# Patient Record
Sex: Male | Born: 1995 | Race: Black or African American | Hispanic: No | Marital: Single | Smoking: Never smoker
Health system: Southern US, Community
[De-identification: ages and names within clinical notes are randomized; demographics above are authoritative.]

## PROBLEM LIST (undated history)

## (undated) DIAGNOSIS — Z789 Other specified health status: Secondary | ICD-10-CM

## (undated) HISTORY — PX: NO PAST SURGERIES: SHX2092

---

## 2021-01-23 ENCOUNTER — Encounter (HOSPITAL_COMMUNITY): Payer: Self-pay | Admitting: *Deleted

## 2021-01-23 ENCOUNTER — Inpatient Hospital Stay (HOSPITAL_COMMUNITY)
Admission: EM | Admit: 2021-01-23 | Discharge: 2021-01-27 | DRG: 440 | Disposition: A | Discharge status: Home / Self Care | Payer: BC Managed Care – PPO | Attending: Internal Medicine | Admitting: Internal Medicine

## 2021-01-23 ENCOUNTER — Emergency Department (HOSPITAL_COMMUNITY): Payer: BC Managed Care – PPO

## 2021-01-23 ENCOUNTER — Emergency Department (HOSPITAL_COMMUNITY)
Admission: EM | Admit: 2021-01-23 | Discharge: 2021-01-23 | Disposition: A | Discharge status: Home / Self Care | Payer: BC Managed Care – PPO | Source: Home / Self Care | Attending: Emergency Medicine | Admitting: Emergency Medicine

## 2021-01-23 ENCOUNTER — Encounter (HOSPITAL_COMMUNITY): Payer: Self-pay

## 2021-01-23 ENCOUNTER — Other Ambulatory Visit: Payer: Self-pay

## 2021-01-23 DIAGNOSIS — K852 Alcohol induced acute pancreatitis without necrosis or infection: Secondary | ICD-10-CM | POA: Insufficient documentation

## 2021-01-23 DIAGNOSIS — R1013 Epigastric pain: Secondary | ICD-10-CM | POA: Diagnosis not present

## 2021-01-23 DIAGNOSIS — K859 Acute pancreatitis without necrosis or infection, unspecified: Secondary | ICD-10-CM | POA: Diagnosis present

## 2021-01-23 DIAGNOSIS — K219 Gastro-esophageal reflux disease without esophagitis: Secondary | ICD-10-CM | POA: Diagnosis present

## 2021-01-23 DIAGNOSIS — K858 Other acute pancreatitis without necrosis or infection: Secondary | ICD-10-CM | POA: Diagnosis present

## 2021-01-23 DIAGNOSIS — Z20822 Contact with and (suspected) exposure to covid-19: Secondary | ICD-10-CM | POA: Diagnosis present

## 2021-01-23 DIAGNOSIS — F1010 Alcohol abuse, uncomplicated: Secondary | ICD-10-CM | POA: Diagnosis present

## 2021-01-23 HISTORY — DX: Other specified health status: Z78.9

## 2021-01-23 LAB — URINALYSIS, ROUTINE W REFLEX MICROSCOPIC
Bacteria, UA: NONE SEEN
Glucose, UA: NEGATIVE mg/dL
Ketones, ur: 80 mg/dL — AB
Leukocytes,Ua: NEGATIVE
Nitrite: NEGATIVE
Protein, ur: >=300 mg/dL — AB
Specific Gravity, Urine: 1.039 — ABNORMAL HIGH (ref 1.005–1.030)
pH: 5 (ref 5.0–8.0)

## 2021-01-23 LAB — COMPREHENSIVE METABOLIC PANEL
ALT: 24 U/L (ref 0–44)
ALT: 22 U/L (ref 0–44)
AST: 28 U/L (ref 15–41)
AST: 27 U/L (ref 15–41)
Albumin: 4.8 g/dL (ref 3.5–5.0)
Albumin: 4.8 g/dL (ref 3.5–5.0)
Alkaline Phosphatase: 54 U/L (ref 38–126)
Alkaline Phosphatase: 55 U/L (ref 38–126)
Anion gap: 12 (ref 5–15)
Anion gap: 14 (ref 5–15)
BUN: 10 mg/dL (ref 6–20)
BUN: 9 mg/dL (ref 6–20)
CO2: 27 mmol/L (ref 22–32)
CO2: 27 mmol/L (ref 22–32)
Calcium: 10.1 mg/dL (ref 8.9–10.3)
Calcium: 10.2 mg/dL (ref 8.9–10.3)
Chloride: 98 mmol/L (ref 98–111)
Chloride: 95 mmol/L — ABNORMAL LOW (ref 98–111)
Creatinine, Ser: 1.23 mg/dL (ref 0.61–1.24)
Creatinine, Ser: 1.22 mg/dL (ref 0.61–1.24)
GFR, Estimated: >60 mL/min (ref >60)
GFR, Estimated: >60 mL/min (ref >60)
Glucose, Bld: 147 mg/dL — ABNORMAL HIGH (ref 70–99)
Glucose, Bld: 150 mg/dL — ABNORMAL HIGH (ref 70–99)
Potassium: 3.7 mmol/L (ref 3.5–5.1)
Potassium: 3.7 mmol/L (ref 3.5–5.1)
Sodium: 137 mmol/L (ref 135–145)
Sodium: 136 mmol/L (ref 135–145)
Total Bilirubin: 1.6 mg/dL — ABNORMAL HIGH (ref 0.3–1.2)
Total Bilirubin: 1.6 mg/dL — ABNORMAL HIGH (ref 0.3–1.2)
Total Protein: 8.4 g/dL — ABNORMAL HIGH (ref 6.5–8.1)
Total Protein: 8.3 g/dL — ABNORMAL HIGH (ref 6.5–8.1)

## 2021-01-23 LAB — CBC WITH DIFFERENTIAL/PLATELET
Abs Immature Granulocytes: 0.03 10*3/uL (ref 0.00–0.07)
Basophils Absolute: 0 10*3/uL (ref 0.0–0.1)
Basophils Relative: 0 %
Eosinophils Absolute: 0 10*3/uL (ref 0.0–0.5)
Eosinophils Relative: 0 %
HCT: 46.4 % (ref 39.0–52.0)
Hemoglobin: 15.9 g/dL (ref 13.0–17.0)
Immature Granulocytes: 0 %
Lymphocytes Relative: 9 %
Lymphs Abs: 0.9 10*3/uL (ref 0.7–4.0)
MCH: 31.3 pg (ref 26.0–34.0)
MCHC: 34.3 g/dL (ref 30.0–36.0)
MCV: 91.3 fL (ref 80.0–100.0)
Monocytes Absolute: 0.7 10*3/uL (ref 0.1–1.0)
Monocytes Relative: 6 %
Neutro Abs: 9 10*3/uL — ABNORMAL HIGH (ref 1.7–7.7)
Neutrophils Relative %: 85 %
Platelets: 307 10*3/uL (ref 150–400)
RBC: 5.08 MIL/uL (ref 4.22–5.81)
RDW: 13.1 % (ref 11.5–15.5)
WBC: 10.7 10*3/uL — ABNORMAL HIGH (ref 4.0–10.5)
nRBC: 0 % (ref 0.0–0.2)

## 2021-01-23 LAB — CBC
HCT: 44.3 % (ref 39.0–52.0)
Hemoglobin: 14.9 g/dL (ref 13.0–17.0)
MCH: 31 pg (ref 26.0–34.0)
MCHC: 33.6 g/dL (ref 30.0–36.0)
MCV: 92.3 fL (ref 80.0–100.0)
Platelets: 292 10*3/uL (ref 150–400)
RBC: 4.8 MIL/uL (ref 4.22–5.81)
RDW: 13.3 % (ref 11.5–15.5)
WBC: 9.4 10*3/uL (ref 4.0–10.5)
nRBC: 0 % (ref 0.0–0.2)

## 2021-01-23 LAB — ETHANOL: Alcohol, Ethyl (B): <10 mg/dL (ref <10)

## 2021-01-23 LAB — RESP PANEL BY RT-PCR (FLU A&B, COVID) ARPGX2
Influenza A by PCR: NEGATIVE
Influenza B by PCR: NEGATIVE
SARS Coronavirus 2 by RT PCR: NEGATIVE

## 2021-01-23 LAB — LIPASE, BLOOD
Lipase: 314 U/L — ABNORMAL HIGH (ref 11–51)
Lipase: 1027 U/L — ABNORMAL HIGH (ref 11–51)

## 2021-01-23 MED ORDER — IOHEXOL 300 MG/ML  SOLN
100.0000 mL | Freq: Once | INTRAMUSCULAR | Status: AC | PRN
  Administered 2021-01-23: 100 mL via INTRAVENOUS

## 2021-01-23 MED ORDER — ONDANSETRON 4 MG PO TBDP
4.0000 mg | ORAL_TABLET | Freq: Once | ORAL | Status: AC
  Administered 2021-01-23: 4 mg via ORAL
  Filled 2021-01-23: qty 1

## 2021-01-23 MED ORDER — ONDANSETRON 4 MG PO TBDP: 4.0000 mg | ORAL_TABLET | Freq: Three times a day (TID) | ORAL | 0 refills | Status: DC | PRN

## 2021-01-23 MED ORDER — HYDROMORPHONE HCL 1 MG/ML IJ SOLN
1.0000 mg | Freq: Once | INTRAMUSCULAR | Status: AC
  Administered 2021-01-23: 1 mg via INTRAVENOUS
  Filled 2021-01-23: qty 1

## 2021-01-23 MED ORDER — LIDOCAINE VISCOUS HCL 2 % MT SOLN
15.0000 mL | Freq: Once | OROMUCOSAL | Status: AC
  Administered 2021-01-23: 15 mL via ORAL
  Filled 2021-01-23: qty 15

## 2021-01-23 MED ORDER — ENOXAPARIN SODIUM 40 MG/0.4ML IJ SOSY
40.0000 mg | PREFILLED_SYRINGE | INTRAMUSCULAR | Status: DC
  Administered 2021-01-23 – 2021-01-26 (×4): 40 mg via SUBCUTANEOUS
  Filled 2021-01-23 (×4): qty 0.4

## 2021-01-23 MED ORDER — SODIUM CHLORIDE 0.9% FLUSH
3.0000 mL | Freq: Two times a day (BID) | INTRAVENOUS | Status: DC
  Administered 2021-01-24 – 2021-01-27 (×5): 3 mL via INTRAVENOUS

## 2021-01-23 MED ORDER — ALUM & MAG HYDROXIDE-SIMETH 200-200-20 MG/5ML PO SUSP
30.0000 mL | Freq: Once | ORAL | Status: AC
  Administered 2021-01-23: 30 mL via ORAL
  Filled 2021-01-23: qty 30

## 2021-01-23 MED ORDER — ONDANSETRON HCL 4 MG/2ML IJ SOLN
4.0000 mg | Freq: Once | INTRAMUSCULAR | Status: AC
  Administered 2021-01-23: 4 mg via INTRAVENOUS
  Filled 2021-01-23: qty 2

## 2021-01-23 MED ORDER — SODIUM CHLORIDE (PF) 0.9 % IJ SOLN
INTRAMUSCULAR | Status: AC
  Filled 2021-01-23: qty 50

## 2021-01-23 MED ORDER — ONDANSETRON HCL 4 MG/2ML IJ SOLN
4.0000 mg | Freq: Four times a day (QID) | INTRAMUSCULAR | Status: DC | PRN
  Administered 2021-01-24: 4 mg via INTRAVENOUS
  Filled 2021-01-23: qty 2

## 2021-01-23 MED ORDER — FENTANYL CITRATE (PF) 100 MCG/2ML IJ SOLN
50.0000 ug | Freq: Once | INTRAMUSCULAR | Status: AC
Start: 2021-01-23 — End: 2021-01-23
  Administered 2021-01-23: 50 ug via INTRAVENOUS
  Filled 2021-01-23: qty 2

## 2021-01-23 MED ORDER — SODIUM CHLORIDE 0.9 % IV BOLUS
1000.0000 mL | Freq: Once | INTRAVENOUS | Status: AC
  Administered 2021-01-23: 1000 mL via INTRAVENOUS

## 2021-01-23 MED ORDER — ONDANSETRON HCL 4 MG PO TABS: 4.0000 mg | ORAL_TABLET | Freq: Four times a day (QID) | ORAL | Status: DC | PRN

## 2021-01-23 MED ORDER — LACTATED RINGERS IV SOLN: INTRAVENOUS | Status: AC

## 2021-01-23 MED ORDER — OXYCODONE-ACETAMINOPHEN 5-325 MG PO TABS
1.0000 | ORAL_TABLET | Freq: Once | ORAL | Status: AC
  Administered 2021-01-23: 1 via ORAL
  Filled 2021-01-23: qty 1

## 2021-01-23 MED ORDER — MORPHINE SULFATE (PF) 2 MG/ML IV SOLN
2.0000 mg | INTRAVENOUS | Status: DC | PRN
  Administered 2021-01-23 – 2021-01-25 (×10): 2 mg via INTRAVENOUS
  Filled 2021-01-23 (×11): qty 1

## 2021-01-23 MED ORDER — HYDROMORPHONE HCL 1 MG/ML IJ SOLN
0.5000 mg | Freq: Once | INTRAMUSCULAR | Status: AC
  Administered 2021-01-23: 0.5 mg via INTRAVENOUS
  Filled 2021-01-23: qty 1

## 2021-01-23 NOTE — ED Notes (Signed)
ED TO INPATIENT HANDOFF REPORT  ED Nurse Name and Phone #: [phone], Charise Carwin RN   S Name/Age/Gender Garrett Kane 25 y.o. male Room/Bed: WA10/WA10  Code Status   Code Status: Full Code  Home/SNF/Other Home Patient oriented to: self, place, time and situation Is this baseline? Yes   Triage Complete: Triage complete  Chief Complaint Acute pancreatitis without infection or necrosis, unspecified pancreatitis type [K85.90]  Triage Note Patient arrived stating he was discharged today at 4pm diagnosed with pancreatitis and told to come back if pain gets worse. States his pharmacy was closed and now his pain is back      Allergies No Known Allergies  Level of Care/Admitting Diagnosis ED Disposition    ED Disposition  Admit   Condition  --   Comment  Hospital Area: [facility] [[id number]]  Level of Care: Med-Surg [16]  May place patient in observation at [facility] or [facility] if equivalent level of care is available:: Yes  Covid Evaluation: Confirmed COVID Negative  Diagnosis: Acute pancreatitis without infection or necrosis, unspecified pancreatitis type [[id number]]  Admitting Physician: Charlsie Quest [[id number]]  Attending Physician: Charlsie Quest [[id number]]         B Medical/Surgery History Past Medical History:  Diagnosis Date  . Medical history non-contributory    Past Surgical History:  Procedure Laterality Date  . NO PAST SURGERIES       A IV Location/Drains/Wounds Patient Lines/Drains/Airways Status    Active Line/Drains/Airways    Name Placement date Placement time Site Days   Peripheral IV 01/23/21 22 G Right Antecubital 01/23/21  2012  Antecubital  less than 1   Peripheral IV 01/23/21 20 G Left;Posterior Hand 01/23/21  2127  Hand  less than 1          Intake/Output Last 24 hours  Intake/Output Summary (Last 24 hours) at 01/23/2021 2350 Last data filed at 01/23/2021 2248 Gross per 24 hour  Intake 1000 ml  Output --   Net 1000 ml    Labs/Imaging Results for orders placed or performed during the hospital encounter of 01/23/21 (from the past 48 hour(s))  Lipase, blood     Status: Abnormal   Collection Time: 01/23/21  8:04 PM  Result Value Ref Range   Lipase 1,027 (H) 11 - 51 U/L    Comment: RESULTS CONFIRMED BY MANUAL DILUTION Performed at [facility], [address], [place], [place] [address]   Ethanol     Status: None   Collection Time: 01/23/21  8:04 PM  Result Value Ref Range   Alcohol, Ethyl (B) <10 <10 mg/dL    Comment: (NOTE) Lowest detectable limit for serum alcohol is 10 mg/dL.  For medical purposes only. Performed at [facility], [address] [place], [place] [address]   Comprehensive metabolic panel     Status: Abnormal   Collection Time: 01/23/21  8:04 PM  Result Value Ref Range   Sodium 136 135 - 145 mmol/L   Potassium 3.7 3.5 - 5.1 mmol/L   Chloride 95 (L) 98 - 111 mmol/L   CO2 27 22 - 32 mmol/L   Glucose, Bld 150 (H) 70 - 99 mg/dL    Comment: Glucose reference range applies only to samples taken after fasting for at least 8 hours.   BUN 9 6 - 20 mg/dL   Creatinine, Ser 1.54 0.61 - 1.24 mg/dL   Calcium 00.8 8.9 - 67.6 mg/dL   Total Protein 8.3 (H) 6.5 -  8.1 g/dL   Albumin 4.8 3.5 - 5.0 g/dL   AST 27 15 - 41 U/L   ALT 22 0 - 44 U/L   Alkaline Phosphatase 55 38 - 126 U/L   Total Bilirubin 1.6 (H) 0.3 - 1.2 mg/dL   GFR, Estimated >12 >45 mL/min    Comment: (NOTE) Calculated using the CKD-EPI Creatinine Equation (2021)    Anion gap 14 5 - 15    Comment: Performed at [facility], [address], [place], [place] [address]  CBC with Differential     Status: Abnormal   Collection Time: 01/23/21  8:04 PM  Result Value Ref Range   WBC 10.7 (H) 4.0 - 10.5 K/uL   RBC 5.08 4.22 - 5.81 MIL/uL   Hemoglobin 15.9 13.0 - 17.0 g/dL   HCT 33.8 25.0 - 53.9 %   MCV 91.3 80.0 - 100.0 fL   MCH 31.3 26.0 - 34.0 pg   MCHC  34.3 30.0 - 36.0 g/dL   RDW 76.7 34.1 - 93.7 %   Platelets 307 150 - 400 K/uL   nRBC 0.0 0.0 - 0.2 %   Neutrophils Relative % 85 %   Neutro Abs 9.0 (H) 1.7 - 7.7 K/uL   Lymphocytes Relative 9 %   Lymphs Abs 0.9 0.7 - 4.0 K/uL   Monocytes Relative 6 %   Monocytes Absolute 0.7 0.1 - 1.0 K/uL   Eosinophils Relative 0 %   Eosinophils Absolute 0.0 0.0 - 0.5 K/uL   Basophils Relative 0 %   Basophils Absolute 0.0 0.0 - 0.1 K/uL   Immature Granulocytes 0 %   Abs Immature Granulocytes 0.03 0.00 - 0.07 K/uL    Comment: Performed at [facility], [address], [place], [place] [address]  Resp Panel by RT-PCR (Flu A&B, Covid) Nasopharyngeal Swab     Status: None   Collection Time: 01/23/21  8:05 PM   Specimen: Nasopharyngeal Swab; Nasopharyngeal(NP) swabs in vial transport medium  Result Value Ref Range   SARS Coronavirus 2 by RT PCR NEGATIVE NEGATIVE    Comment: (NOTE) SARS-CoV-2 target nucleic acids are NOT DETECTED.  The SARS-CoV-2 RNA is generally detectable in upper respiratory specimens during the acute phase of infection. The lowest concentration of SARS-CoV-2 viral copies this assay can detect is 138 copies/mL. A negative result does not preclude SARS-Cov-2 infection and should not be used as the sole basis for treatment or other patient management decisions. A negative result may occur with  improper specimen collection/handling, submission of specimen other than nasopharyngeal swab, presence of viral mutation(s) within the areas targeted by this assay, and inadequate number of viral copies(<138 copies/mL). A negative result must be combined with clinical observations, patient history, and epidemiological information. The expected result is Negative.  Fact Sheet for Patients:  BloggerCourse.com  Fact Sheet for Healthcare Providers:  SeriousBroker.it  This test is no t yet approved or cleared by the [place] FDA and  has been authorized for detection and/or diagnosis of SARS-CoV-2 by FDA under an Emergency Use Authorization (EUA). This EUA will remain  in effect (meaning this test can be used) for the duration of the COVID-19 declaration under Section 564(b)(1) of the Act, 21 U.S.C.section 360bbb-3(b)(1), unless the authorization is terminated  or revoked sooner.       Influenza A by PCR NEGATIVE NEGATIVE   Influenza B by PCR NEGATIVE NEGATIVE    Comment: (NOTE) The Xpert Xpress SARS-CoV-2/FLU/RSV plus assay is intended as an aid  in the diagnosis of influenza from Nasopharyngeal swab specimens and should not be used as a sole basis for treatment. Nasal washings and aspirates are unacceptable for Xpert Xpress SARS-CoV-2/FLU/RSV testing.  Fact Sheet for Patients: BloggerCourse.comhttps://www.fda.gov/media/152166/download  Fact Sheet for Healthcare Providers: SeriousBroker.ithttps://www.fda.gov/media/152162/download  This test is not yet approved or cleared by the [place] FDA and has been authorized for detection and/or diagnosis of SARS-CoV-2 by FDA under an Emergency Use Authorization (EUA). This EUA will remain in effect (meaning this test can be used) for the duration of the COVID-19 declaration under Section 564(b)(1) of the Act, 21 U.S.C. section 360bbb-3(b)(1), unless the authorization is terminated or revoked.  Performed at [facility], [address] [place], [place] [id number]    CT ABDOMEN PELVIS W CONTRAST  Result Date: 01/23/2021 CLINICAL DATA:  Severe epigastric pain, concern for pancreatitis EXAM: CT ABDOMEN AND PELVIS WITH CONTRAST TECHNIQUE: Multidetector CT imaging of the abdomen and pelvis was performed using the standard protocol following bolus administration of intravenous contrast. CONTRAST:  100mL OMNIPAQUE IOHEXOL 300 MG/ML  SOLN COMPARISON:  None. FINDINGS: Lower chest: Lung bases are clear. Normal heart size. No pericardial effusion. Hepatobiliary: No  worrisome focal liver abnormality is seen. Normal gallbladder. No visible calcified gallstones. No biliary ductal dilatation. Pancreas: Diffusely edematous appearance of the pancreas with some slight heterogeneity towards the pancreatic tail. Surrounding edematous peripancreatic fluid is noted. No large regions of necrosis. No pancreatic ductal dilatation. No organized abscess or collection. Spleen: Normal in size. No concerning splenic lesions. Adrenals/Urinary Tract: Normal adrenals. Kidneys are normally located with symmetric enhancement. No suspicious renal lesion, urolithiasis or hydronephrosis. Urinary bladder is largely decompressed at the time of exam and therefore poorly evaluated by CT imaging. Stomach/Bowel: Distal esophagus and stomach are unremarkable. Thickened and heterogeneous appearance of the duodenal sweep, particularly towards the head and uncinate of the pancreas may be reactive duodenitis. No other small bowel thickening or dilatation. Some mild edematous changes are seen along the colon, particularly within the retroperitoneal segments which may be reactive to the peripancreatic inflammatory change. Normal appendix. No evidence of obstruction. Vascular/Lymphatic: No significant vascular findings are present. No enlarged abdominal or pelvic lymph nodes. Reproductive: The prostate and seminal vesicles are unremarkable. Other: With peripancreatic edematous fluid also with fluid tracking in the retroperitoneum and along the right pericolic gutter. No free air. No bowel containing hernia. Musculoskeletal: No acute osseous abnormality or suspicious osseous lesion. IMPRESSION: 1. Features most consistent with an acute interstitial edematous pancreatitis. Some slight heterogeneity of the pancreatic tail is more nonspecific without large region pancreatic necrosis or organized collection at this time. Adjacent edematous features and likely reactive fluid throughout the retroperitoneum and tracking into  right pericolic gutter. Recommend correlation with lipase. 2. Thickening and heterogeneity of the duodenal sweep, particularly the 2nd to 3rd portion as it crosses adjacent the duodenal head and uncinate is favored to be reactive. Additional likely reactive edematous thickening of the colon as well. Could correlate with clinical assessment of bowel symptoms Electronically Signed   By: Kreg ShropshirePrice  DeHay M.D.   On: 01/23/2021 15:18    Pending Labs Unresulted Labs (From admission, onward)    Start     Ordered   01/24/21 0500  HIV Antibody (routine testing w rflx)  (HIV Antibody (Routine testing w reflex) panel)  Tomorrow morning,   R        01/23/21 2226   01/24/21 0500  Hemoglobin A1c  Tomorrow morning,   R  01/23/21 2226   01/24/21 0500  Comprehensive metabolic panel  Tomorrow morning,   R        01/23/21 2226   01/24/21 0500  CBC  Tomorrow morning,   R        01/23/21 2226   01/23/21 2004  Urine rapid drug screen (hosp performed)  ONCE - STAT,   STAT        01/23/21 2004   01/23/21 2004  Urinalysis, Routine w reflex microscopic  ONCE - STAT,   STAT        01/23/21 2004          Vitals/Pain Today's Vitals   01/23/21 2145 01/23/21 2200 01/23/21 2230 01/23/21 2249  BP: (!) 141/105 (!) 133/104 (!) 156/110   Pulse: 79 91 73   Resp:      Temp:      TempSrc:      SpO2: 100% 93% 100%   PainSc:    7     Isolation Precautions No active isolations  Medications Medications  morphine 2 MG/ML injection 2 mg (2 mg Intravenous Given 01/23/21 2242)  enoxaparin (LOVENOX) injection 40 mg (40 mg Subcutaneous Given 01/23/21 2242)  sodium chloride flush (NS) 0.9 % injection 3 mL (3 mLs Intravenous Not Given 01/23/21 2249)  lactated ringers infusion ( Intravenous New Bag/Given 01/23/21 2243)  ondansetron (ZOFRAN) tablet 4 mg (has no administration in time range)    Or  ondansetron (ZOFRAN) injection 4 mg (has no administration in time range)  sodium chloride 0.9 % bolus 1,000 mL (0 mLs  Intravenous Stopped 01/23/21 2248)  HYDROmorphone (DILAUDID) injection 0.5 mg (0.5 mg Intravenous Given 01/23/21 2018)  ondansetron (ZOFRAN) injection 4 mg (4 mg Intravenous Given 01/23/21 2017)  HYDROmorphone (DILAUDID) injection 1 mg (1 mg Intravenous Given 01/23/21 2116)    Mobility walks Low fall risk   Focused Assessments    R Recommendations: See Admitting Provider Note  Report given to:   Additional Notes:

## 2021-01-23 NOTE — ED Notes (Signed)
Pt went to the bathroom to void but was not able to

## 2021-01-23 NOTE — Discharge Instructions (Addendum)
You were diagnosed with pancreatitis today.  For the next couple days, recommend only very bland and boring foods.  Stay hydrated.  Take Zofran as needed for nausea and vomiting.  If you develop uncontrolled vomiting, pain, fever or other new concerning symptom, come back to ER for reassessment.  It is very important that you stay away from alcohol as this is likely the trigger of your pancreatitis today.  Please follow-up with a gastroenterologist regarding your pancreatitis.  They may recommend additional testing.

## 2021-01-23 NOTE — ED Provider Notes (Signed)
[facility]EM[facility]GENCY DEPT Provider Note   CSN: [id number] Arrival date & time: 01/23/21  1936     History Chief Complaint  Patient presents with   Pancreatitis    Garrett Kane is a 25 y.o. male.  25 year old male with prior medical history as detailed below presents for evaluation.  Patient was seen earlier today for same complaint.  He was diagnosed with pancreatitis.  Patient returns now stating that he was unable to get pain medications for home use.  New complaint of significant abdominal discomfort.  He reported associated nausea and vomiting.  He denies prior hx of pancreatitis. He drinks alcohol everyday.   The history is provided by the patient and medical records.  Illness Location:  Abd pain, pancreatitis Severity:  Severe Onset quality:  Gradual Duration:  2 days Timing:  Constant Progression:  Worsening Chronicity:  New     Past Medical History:  Diagnosis Date   Medical history non-contributory     Patient Active Problem List   Diagnosis Date Noted   Acute pancreatitis without infection or necrosis, unspecified pancreatitis type 01/23/2021    Past Surgical History:  Procedure Laterality Date   NO PAST SURGERIES         History reviewed. No pertinent family history.  Social History   Tobacco Use   Smoking status: Never   Smokeless tobacco: Never  Vaping Use   Vaping Use: Never used  Substance Use Topics   Alcohol use: Yes    Comment: drinks almost daily   Drug use: Yes    Types: Marijuana    Comment: daily use    Home Medications Prior to Admission medications   Medication Sig Start Date End Date Taking? Authorizing Provider  ondansetron (ZOFRAN ODT) 4 MG disintegrating tablet Take 1 tablet (4 mg total) by mouth every 8 (eight) hours as needed for nausea or vomiting. 01/23/21   Milagros Loll, MD    Allergies    Patient has no known allergies.  Review of Systems   Review of Systems  All other systems  reviewed and are negative.  Physical Exam Updated Vital Signs BP (!) 143/107   Pulse 75   Temp 97.8 F (36.6 C) (Oral)   Resp 17   SpO2 98%   Physical Exam Vitals and nursing note reviewed.  Constitutional:      General: He is not in acute distress.    Appearance: Normal appearance. He is well-developed.  HENT:     Head: Normocephalic and atraumatic.  Eyes:     Conjunctiva/sclera: Conjunctivae normal.     Pupils: Pupils are equal, round, and reactive to light.  Cardiovascular:     Rate and Rhythm: Normal rate and regular rhythm.     Heart sounds: Normal heart sounds.  Pulmonary:     Effort: Pulmonary effort is normal. No respiratory distress.     Breath sounds: Normal breath sounds.  Abdominal:     General: There is no distension.     Palpations: Abdomen is soft.     Tenderness: There is abdominal tenderness.  Musculoskeletal:        General: No deformity. Normal range of motion.     Cervical back: Normal range of motion and neck supple.  Skin:    General: Skin is warm and dry.  Neurological:     General: No focal deficit present.     Mental Status: He is alert and oriented to person, place, and time.    ED Results /  Procedures / Treatments   Labs (all labs ordered are listed, but only abnormal results are displayed) Labs Reviewed  COMPREHENSIVE METABOLIC PANEL - Abnormal; Notable for the following components:      Result Value   Chloride 95 (*)    Glucose, Bld 150 (*)    Total Protein 8.3 (*)    Total Bilirubin 1.6 (*)    All other components within normal limits  CBC WITH DIFFERENTIAL/PLATELET - Abnormal; Notable for the following components:   WBC 10.7 (*)    Neutro Abs 9.0 (*)    All other components within normal limits  RESP PANEL BY RT-PCR (FLU A&B, COVID) ARPGX2  ETHANOL  LIPASE, BLOOD  RAPID URINE DRUG SCREEN, HOSP PERFORMED  URINALYSIS, ROUTINE W REFLEX MICROSCOPIC    EKG None  Radiology CT ABDOMEN PELVIS W CONTRAST  Result Date:  01/23/2021 CLINICAL DATA:  Severe epigastric pain, concern for pancreatitis EXAM: CT ABDOMEN AND PELVIS WITH CONTRAST TECHNIQUE: Multidetector CT imaging of the abdomen and pelvis was performed using the standard protocol following bolus administration of intravenous contrast. CONTRAST:  OMNIPAQUE IOHEXOL 300 MG/ML  SOLN COMPARISON:  None. FINDINGS: Lower chest: Lung bases are clear. Normal heart size. No pericardial effusion. Hepatobiliary: No worrisome focal liver abnormality is seen. Normal gallbladder. No visible calcified gallstones. No biliary ductal dilatation. Pancreas: Diffusely edematous appearance of the pancreas with some slight heterogeneity towards the pancreatic tail. Surrounding edematous peripancreatic fluid is noted. No large regions of necrosis. No pancreatic ductal dilatation. No organized abscess or collection. Spleen: Normal in size. No concerning splenic lesions. Adrenals/Urinary Tract: Normal adrenals. Kidneys are normally located with symmetric enhancement. No suspicious renal lesion, urolithiasis or hydronephrosis. Urinary bladder is largely decompressed at the time of exam and therefore poorly evaluated by CT imaging. Stomach/Bowel: Distal esophagus and stomach are unremarkable. Thickened and heterogeneous appearance of the duodenal sweep, particularly towards the head and uncinate of the pancreas may be reactive duodenitis. No other small bowel thickening or dilatation. Some mild edematous changes are seen along the colon, particularly within the retroperitoneal segments which may be reactive to the peripancreatic inflammatory change. Normal appendix. No evidence of obstruction. Vascular/Lymphatic: No significant vascular findings are present. No enlarged abdominal or pelvic lymph nodes. Reproductive: The prostate and seminal vesicles are unremarkable. Other: With peripancreatic edematous fluid also with fluid tracking in the retroperitoneum and along the right pericolic gutter. No  free air. No bowel containing hernia. Musculoskeletal: No acute osseous abnormality or suspicious osseous lesion. IMPRESSION: 1. Features most consistent with an acute interstitial edematous pancreatitis. Some slight heterogeneity of the pancreatic tail is more nonspecific without large region pancreatic necrosis or organized collection at this time. Adjacent edematous features and likely reactive fluid throughout the retroperitoneum and tracking into right pericolic gutter. Recommend correlation with lipase. 2. Thickening and heterogeneity of the duodenal sweep, particularly the 2nd to 3rd portion as it crosses adjacent the duodenal head and uncinate is favored to be reactive. Additional likely reactive edematous thickening of the colon as well. Could correlate with clinical assessment of bowel symptoms Electronically Signed   By: Kreg Shropshire M.D.   On: 01/23/2021 15:18    Procedures Procedures   Medications Ordered in ED Medications  sodium chloride 0.9 % bolus 1,000 mL (1,000 mLs Intravenous New Bag/Given 01/23/21 2021)  HYDROmorphone (DILAUDID) injection 0.5 mg (0.5 mg Intravenous Given 01/23/21 2018)  ondansetron (ZOFRAN) injection 4 mg (4 mg Intravenous Given 01/23/21 2017)  HYDROmorphone (DILAUDID) injection 1 mg (1 mg Intravenous Given  01/23/21 2116)    ED Course  I have reviewed the triage vital signs and the nursing notes.  Pertinent labs & imaging results that were available during my care of the patient were reviewed by me and considered in my medical decision making (see chart for details).    MDM Rules/Calculators/A&P                          MDM  MSE complete  Jayon Matton was evaluated in Emergency Department on 01/23/2021 for the symptoms described in the history of present illness. He was evaluated in the context of the global COVID-19 pandemic, which necessitated consideration that the patient might be at risk for infection with the SARS-CoV-2 virus that causes COVID-19.  Institutional protocols and algorithms that pertain to the evaluation of patients at risk for COVID-19 are in a state of rapid change based on information released by regulatory bodies including the CDC and federal and state organizations. These policies and algorithms were followed during the patient's care in the ED.   Patient with recently diagnosed pancreatitis.  Patient with uncontrolled pain.  Patient will require admission for further work-up and treatment.  Hospitalist service is aware of case and will evaluate for same.  Final Clinical Impression(s) / ED Diagnoses Final diagnoses:  Acute pancreatitis, unspecified complication status, unspecified pancreatitis type    Rx / DC Orders ED Discharge Orders     None        Wynetta Fines, MD 01/23/21 2148

## 2021-01-23 NOTE — ED Provider Notes (Signed)
[facility]-EM[facility]GENCY DEPT Provider Note   CSN: [id number] Arrival date & time: 01/23/21  1033     History Chief Complaint  Patient presents with   Abdominal Pain    epigastric   Back Pain    Garrett Kane is a 25 y.o. male.  Presents to ER with concern for abdominal pain.  Patient reports pain is primarily in his upper abdomen, does radiate to back, sharp, stabbing pain.  Relatively constant.  Has been ongoing for the past day or so.  Associated nausea this morning, multiple episodes of vomiting, nonbloody nonbilious.  No pain with urination, has noted urine slightly darker than normal.  States that he does drink heavily on a regular basis.  Reports he has had similar episodes to this pain today and was diagnosed with gastritis.  Denies having prior history of pancreatitis.  HPI     Past Medical History:  Diagnosis Date   Medical history non-contributory     Patient Active Problem List   Diagnosis Date Noted   Acute pancreatitis without infection or necrosis, unspecified pancreatitis type 01/23/2021    Past Surgical History:  Procedure Laterality Date   NO PAST SURGERIES         No family history on file.  Social History   Tobacco Use   Smoking status: Never   Smokeless tobacco: Never  Vaping Use   Vaping Use: Never used  Substance Use Topics   Alcohol use: Yes    Comment: drinks almost daily, about 1 pint liquor daily   Drug use: Yes    Types: Marijuana    Comment: daily use    Home Medications Prior to Admission medications   Medication Sig Start Date End Date Taking? Authorizing Provider  ondansetron (ZOFRAN ODT) 4 MG disintegrating tablet Take 1 tablet (4 mg total) by mouth every 8 (eight) hours as needed for nausea or vomiting. Patient not taking: No sig reported 01/23/21  Yes Travian Kerner, Quitman Livings, MD  ibuprofen (ADVIL) 200 MG tablet Take 400 mg by mouth every 6 (six) hours as needed.    [provider]    Allergies     Patient has no known allergies.  Review of Systems   Review of Systems  Constitutional:  Negative for chills and fever.  HENT:  Negative for ear pain and sore throat.   Eyes:  Negative for pain and visual disturbance.  Respiratory:  Negative for cough and shortness of breath.   Cardiovascular:  Negative for chest pain and palpitations.  Gastrointestinal:  Positive for abdominal pain, nausea and vomiting.  Genitourinary:  Negative for dysuria and hematuria.  Musculoskeletal:  Negative for arthralgias and back pain.  Skin:  Negative for color change and rash.  Neurological:  Negative for seizures and syncope.  All other systems reviewed and are negative.  Physical Exam Updated Vital Signs BP (!) 141/96 (BP Location: Left Arm)   Pulse 79   Temp 98.8 F (37.1 C) (Oral)   Resp 16   SpO2 100%   Physical Exam Vitals and nursing note reviewed.  Constitutional:      Appearance: He is well-developed.  HENT:     Head: Normocephalic and atraumatic.  Eyes:     Conjunctiva/sclera: Conjunctivae normal.  Cardiovascular:     Rate and Rhythm: Normal rate and regular rhythm.     Heart sounds: No murmur heard. Pulmonary:     Effort: Pulmonary effort is normal. No respiratory distress.     Breath sounds: Normal breath  sounds.  Abdominal:     Palpations: Abdomen is soft.     Tenderness: There is abdominal tenderness. There is no guarding or rebound.  Musculoskeletal:     Cervical back: Neck supple.  Skin:    General: Skin is warm and dry.  Neurological:     Mental Status: He is alert.    ED Results / Procedures / Treatments   Labs (all labs ordered are listed, but only abnormal results are displayed) Labs Reviewed  LIPASE, BLOOD - Abnormal; Notable for the following components:      Result Value   Lipase 314 (*)    All other components within normal limits  COMPREHENSIVE METABOLIC PANEL - Abnormal; Notable for the following components:   Glucose, Bld 147 (*)    Total Protein  8.4 (*)    Total Bilirubin 1.6 (*)    All other components within normal limits  URINALYSIS, ROUTINE W REFLEX MICROSCOPIC - Abnormal; Notable for the following components:   Color, Urine AMBER (*)    APPearance HAZY (*)    Specific Gravity, Urine 1.039 (*)    Hgb urine dipstick MODERATE (*)    Bilirubin Urine SMALL (*)    Ketones, ur 80 (*)    Protein, ur >=300 (*)    All other components within normal limits  CBC    EKG None  Radiology CT ABDOMEN PELVIS W CONTRAST  Result Date: 01/23/2021 CLINICAL DATA:  Severe epigastric pain, concern for pancreatitis EXAM: CT ABDOMEN AND PELVIS WITH CONTRAST TECHNIQUE: Multidetector CT imaging of the abdomen and pelvis was performed using the standard protocol following bolus administration of intravenous contrast. CONTRAST:  OMNIPAQUE IOHEXOL 300 MG/ML  SOLN COMPARISON:  None. FINDINGS: Lower chest: Lung bases are clear. Normal heart size. No pericardial effusion. Hepatobiliary: No worrisome focal liver abnormality is seen. Normal gallbladder. No visible calcified gallstones. No biliary ductal dilatation. Pancreas: Diffusely edematous appearance of the pancreas with some slight heterogeneity towards the pancreatic tail. Surrounding edematous peripancreatic fluid is noted. No large regions of necrosis. No pancreatic ductal dilatation. No organized abscess or collection. Spleen: Normal in size. No concerning splenic lesions. Adrenals/Urinary Tract: Normal adrenals. Kidneys are normally located with symmetric enhancement. No suspicious renal lesion, urolithiasis or hydronephrosis. Urinary bladder is largely decompressed at the time of exam and therefore poorly evaluated by CT imaging. Stomach/Bowel: Distal esophagus and stomach are unremarkable. Thickened and heterogeneous appearance of the duodenal sweep, particularly towards the head and uncinate of the pancreas may be reactive duodenitis. No other small bowel thickening or dilatation. Some mild  edematous changes are seen along the colon, particularly within the retroperitoneal segments which may be reactive to the peripancreatic inflammatory change. Normal appendix. No evidence of obstruction. Vascular/Lymphatic: No significant vascular findings are present. No enlarged abdominal or pelvic lymph nodes. Reproductive: The prostate and seminal vesicles are unremarkable. Other: With peripancreatic edematous fluid also with fluid tracking in the retroperitoneum and along the right pericolic gutter. No free air. No bowel containing hernia. Musculoskeletal: No acute osseous abnormality or suspicious osseous lesion. IMPRESSION: 1. Features most consistent with an acute interstitial edematous pancreatitis. Some slight heterogeneity of the pancreatic tail is more nonspecific without large region pancreatic necrosis or organized collection at this time. Adjacent edematous features and likely reactive fluid throughout the retroperitoneum and tracking into right pericolic gutter. Recommend correlation with lipase. 2. Thickening and heterogeneity of the duodenal sweep, particularly the 2nd to 3rd portion as it crosses adjacent the duodenal head and uncinate is favored  to be reactive. Additional likely reactive edematous thickening of the colon as well. Could correlate with clinical assessment of bowel symptoms Electronically Signed   By: Kreg Shropshire M.D.   On: 01/23/2021 15:18    Procedures Procedures   Medications Ordered in ED Medications  alum & mag hydroxide-simeth (MAALOX/MYLANTA) 200-200-20 MG/5ML suspension 30 mL (30 mLs Oral Given 01/23/21 1330)    And  lidocaine (XYLOCAINE) 2 % viscous mouth solution 15 mL (15 mLs Oral Given 01/23/21 1331)  ondansetron (ZOFRAN-ODT) disintegrating tablet 4 mg (4 mg Oral Given 01/23/21 1315)  oxyCODONE-acetaminophen (PERCOCET/ROXICET) 5-325 MG per tablet 1 tablet (1 tablet Oral Given 01/23/21 1314)  fentaNYL (SUBLIMAZE) injection 50 mcg (50 mcg Intravenous Given 01/23/21  1423)  iohexol (OMNIPAQUE) 300 MG/ML solution 100 mL (100 mLs Intravenous Contrast Given 01/23/21 1442)  sodium chloride (PF) 0.9 % injection (  Given by Other 01/23/21 1512)    ED Course  I have reviewed the triage vital signs and the nursing notes.  Pertinent labs & imaging results that were available during my care of the patient were reviewed by me and considered in my medical decision making (see chart for details).    MDM Rules/Calculators/A&P                          25 year old male presents to ER with concern for epigastric pain.  On exam he appears well in no distress, was noted to have tenderness on exam but isolated to epigastric region.  Basic lab work was concerning for elevated lipase.  CT scan checks to further evaluate.  Consistent with acute pancreatitis.  No abscess or pseudocyst.  On reassessment, patient's symptoms were very well controlled, he was able to tolerate fluids orally without difficulty.  Discussed diagnosis of pancreatitis, at present believe he is appropriate for trial of outpatient management with plan to follow-up with primary doctor and gastroenterology.  Given his reported history of alcohol abuse, strong suspicion that this is the culprit.  Counseled on alcohol cessation.  Lengthy discussion with patient regarding return precautions, dietary recommendations, discharged home.    After the discussed management above, the patient was determined to be safe for discharge.  The patient was in agreement with this plan and all questions regarding their care were answered.  ED return precautions were discussed and the patient will return to the ED with any significant worsening of condition.  Final Clinical Impression(s) / ED Diagnoses Final diagnoses:  Alcohol-induced acute pancreatitis, unspecified complication status    Rx / DC Orders ED Discharge Orders          Ordered    ondansetron (ZOFRAN ODT) 4 MG disintegrating tablet  Every 8 hours PRN         01/23/21 1539             Milagros Loll, MD 01/24/21 [phone]

## 2021-01-23 NOTE — ED Notes (Signed)
Pt was taken to CT

## 2021-01-23 NOTE — H&P (Signed)
History and Physical    Garrett Kane ACZ:[id number] DOB: January 17, 1996 DOA: 01/23/2021  PCP: Pcp, No  Patient coming from: Home  I have personally briefly reviewed patient's old medical records in [place]  Chief Complaint: Abdominal pain  HPI: Garrett Kane is a 25 y.o. male with medical history significant for alcohol use who presented to the ED for evaluation of abdominal pain.  Patient reports 2 days of new onset upper abdominal pain with radiation to his back described as a sharp sensation.  He has associated nausea, vomiting, and inability to maintain adequate oral intake.  He came to the ED earlier today (6/28) and was diagnosed with pancreatitis on CT imaging with elevated lipase.  He was treated with antiemetics and analgesics and symptoms improved.  He was discharged home however had recurrent severe epigastric discomfort therefore came back to the ED.  Patient states he had a similar but milder episode about 3 years ago at which time he was diagnosed with GERD.  He does report significant alcohol use, about 1 pint of liquor per day.  Last drink was about 3 days ago prior to symptom onset.  He reports occasional marijuana use.  He denies any tobacco, cocaine, or injection drug use.  He denies any known medical conditions in his immediate family.  ED Course:  Initial vitals show BP 147/109, pulse 96, RR 20, temp 98.8 F, SPO2 100% on room air.  Labs showed lipase 314, T bili 1.6, AST 28, ALT 24, alk phos 54, sodium 137, potassium 3.7, bicarb 27, BUN 10, creatinine 1.23, serum glucose 147.  Urinalysis with 80 ketones, >300 protein, negative for UTI.  SARS-CoV-2 PCR negative.  CT abdomen/pelvis with contrast showed changes consistent with acute interstitial edematous pancreatitis.  Slight heterogeneity of the pancreatic tail is seen and nonspecific without large region pancreatic necrosis or organized collection.  Adjacent edematous features and likely reactive fluid noted.   Thickening and heterogeneity of the duodenal sleep also seen and favored to be reactive as well as additional likely reactive edematous thickening of the colon.  Patient was initially discharged to home after symptomatic improvement however symptoms recurred and worsened there.  He returned to the ED.  He was given 1 L normal saline, IV Zofran, and IV Dilaudid 0.5 mg and 1 mg.  The hospitalist service was consulted to admit for further evaluation and management.  Review of Systems: All systems reviewed and are negative except as documented in history of present illness above.   Past Medical History:  Diagnosis Date   Medical history non-contributory     Past Surgical History:  Procedure Laterality Date   NO PAST SURGERIES      Social History:  reports that he has never smoked. He has never used smokeless tobacco. He reports current alcohol use. He reports current drug use. Drug: Marijuana.  No Known Allergies  History reviewed. No pertinent family history.   Prior to Admission medications   Medication Sig Start Date End Date Taking? Authorizing Provider  ondansetron (ZOFRAN ODT) 4 MG disintegrating tablet Take 1 tablet (4 mg total) by mouth every 8 (eight) hours as needed for nausea or vomiting. 01/23/21   Lucrezia Starch, MD    Physical Exam: Vitals:   01/23/21 1939 01/23/21 2045 01/23/21 2100  BP: (!) 137/106 (!) 148/102 (!) 143/107  Pulse: 90 68 75  Resp: 15  17  Temp: 97.8 F (36.6 C)    TempSrc: Oral    SpO2: 100% 96% 98%  Constitutional: Resting in bed, NAD, calm, comfortable Eyes: PERRL, lids and conjunctivae normal ENMT: Mucous membranes are moist. Posterior pharynx clear of any exudate or lesions.Normal dentition.  Neck: normal, supple, no masses. Respiratory: clear to auscultation bilaterally, no wheezing, no crackles. Normal respiratory effort. No accessory muscle use.  Cardiovascular: Regular rate and rhythm, no murmurs / rubs / gallops. No extremity  edema. 2+ pedal pulses. Abdomen: Epigastric tenderness, no masses palpated. No hepatosplenomegaly. Bowel sounds positive.  Musculoskeletal: no clubbing / cyanosis. No joint deformity upper and lower extremities. Good ROM, no contractures. Normal muscle tone.  Skin: no rashes, lesions, ulcers. No induration Neurologic: CN 2-12 grossly intact. Sensation intact. Strength 5/5 in all 4.  Psychiatric: Normal judgment and insight. Alert and oriented x 3. Normal mood.   Labs on Admission: I have personally reviewed following labs and imaging studies  CBC: Recent Labs  Lab 01/23/21 1150 01/23/21 2004  WBC 9.4 10.7*  NEUTROABS  --  9.0*  HGB 14.9 15.9  HCT 44.3 46.4  MCV 92.3 91.3  PLT 292 379   Basic Metabolic Panel: Recent Labs  Lab 01/23/21 1150 01/23/21 2004  NA 137 136  K 3.7 3.7  CL 98 95*  CO2 27 27  GLUCOSE 147* 150*  BUN 10 9  CREATININE 1.23 1.22  CALCIUM 10.1 10.2   GFR: CrCl cannot be calculated (Unknown ideal weight.). Liver Function Tests: Recent Labs  Lab 01/23/21 1150 01/23/21 2004  AST 28 27  ALT 24 22  ALKPHOS 54 55  BILITOT 1.6* 1.6*  PROT 8.4* 8.3*  ALBUMIN 4.8 4.8   Recent Labs  Lab 01/23/21 1150  LIPASE 314*   No results for input(s): AMMONIA in the last 168 hours. Coagulation Profile: No results for input(s): INR, PROTIME in the last 168 hours. Cardiac Enzymes: No results for input(s): CKTOTAL, CKMB, CKMBINDEX, TROPONINI in the last 168 hours. BNP (last 3 results) No results for input(s): PROBNP in the last 8760 hours. HbA1C: No results for input(s): HGBA1C in the last 72 hours. CBG: No results for input(s): GLUCAP in the last 168 hours. Lipid Profile: No results for input(s): CHOL, HDL, LDLCALC, TRIG, CHOLHDL, LDLDIRECT in the last 72 hours. Thyroid Function Tests: No results for input(s): TSH, T4TOTAL, FREET4, T3FREE, THYROIDAB in the last 72 hours. Anemia Panel: No results for input(s): VITAMINB12, FOLATE, FERRITIN, TIBC, IRON,  RETICCTPCT in the last 72 hours. Urine analysis:    Component Value Date/Time   COLORURINE AMBER (A) 01/23/2021 1315   APPEARANCEUR HAZY (A) 01/23/2021 1315   LABSPEC 1.039 (H) 01/23/2021 1315   PHURINE 5.0 01/23/2021 1315   GLUCOSEU NEGATIVE 01/23/2021 1315   HGBUR MODERATE (A) 01/23/2021 1315   BILIRUBINUR SMALL (A) 01/23/2021 1315   KETONESUR 80 (A) 01/23/2021 1315   PROTEINUR >=300 (A) 01/23/2021 1315   NITRITE NEGATIVE 01/23/2021 1315   LEUKOCYTESUR NEGATIVE 01/23/2021 1315    Radiological Exams on Admission: CT ABDOMEN PELVIS W CONTRAST  Result Date: 01/23/2021 CLINICAL DATA:  Severe epigastric pain, concern for pancreatitis EXAM: CT ABDOMEN AND PELVIS WITH CONTRAST TECHNIQUE: Multidetector CT imaging of the abdomen and pelvis was performed using the standard protocol following bolus administration of intravenous contrast. CONTRAST:  151m OMNIPAQUE IOHEXOL 300 MG/ML  SOLN COMPARISON:  None. FINDINGS: Lower chest: Lung bases are clear. Normal heart size. No pericardial effusion. Hepatobiliary: No worrisome focal liver abnormality is seen. Normal gallbladder. No visible calcified gallstones. No biliary ductal dilatation. Pancreas: Diffusely edematous appearance of the pancreas with some slight heterogeneity towards  the pancreatic tail. Surrounding edematous peripancreatic fluid is noted. No large regions of necrosis. No pancreatic ductal dilatation. No organized abscess or collection. Spleen: Normal in size. No concerning splenic lesions. Adrenals/Urinary Tract: Normal adrenals. Kidneys are normally located with symmetric enhancement. No suspicious renal lesion, urolithiasis or hydronephrosis. Urinary bladder is largely decompressed at the time of exam and therefore poorly evaluated by CT imaging. Stomach/Bowel: Distal esophagus and stomach are unremarkable. Thickened and heterogeneous appearance of the duodenal sweep, particularly towards the head and uncinate of the pancreas may be  reactive duodenitis. No other small bowel thickening or dilatation. Some mild edematous changes are seen along the colon, particularly within the retroperitoneal segments which may be reactive to the peripancreatic inflammatory change. Normal appendix. No evidence of obstruction. Vascular/Lymphatic: No significant vascular findings are present. No enlarged abdominal or pelvic lymph nodes. Reproductive: The prostate and seminal vesicles are unremarkable. Other: With peripancreatic edematous fluid also with fluid tracking in the retroperitoneum and along the right pericolic gutter. No free air. No bowel containing hernia. Musculoskeletal: No acute osseous abnormality or suspicious osseous lesion. IMPRESSION: 1. Features most consistent with an acute interstitial edematous pancreatitis. Some slight heterogeneity of the pancreatic tail is more nonspecific without large region pancreatic necrosis or organized collection at this time. Adjacent edematous features and likely reactive fluid throughout the retroperitoneum and tracking into right pericolic gutter. Recommend correlation with lipase. 2. Thickening and heterogeneity of the duodenal sweep, particularly the 2nd to 3rd portion as it crosses adjacent the duodenal head and uncinate is favored to be reactive. Additional likely reactive edematous thickening of the colon as well. Could correlate with clinical assessment of bowel symptoms Electronically Signed   By: Lovena Le M.D.   On: 01/23/2021 15:18    EKG: Not performed.  Assessment/Plan Principal Problem:   Acute pancreatitis without infection or necrosis, unspecified pancreatitis type   Garrett Kane is a 25 y.o. male with medical history significant for alcohol use who is admitted with acute pancreatitis.  Acute pancreatitis: Suspect associated with alcohol use.  CT imaging without evidence of pancreatic necrosis or abscess.  Gallbladder appears normal without visible gallstones or biliary ductal  dilatation. -Keep n.p.o. -Continue IV fluid hydration -Continue analgesics as needed  Alcohol use: Advised on cessation.  No signs/symptoms of withdrawal on admission.  DVT prophylaxis: Lovenox Code Status: Full code Family Communication: Discussed with patient, he has discussed with family Disposition Plan: From home and likely discharged home pending clinical progress Consults called: None Level of care: Med-Surg Admission status:  Status is: Observation  The patient remains OBS appropriate and will d/c before 2 midnights.  Dispo: The patient is from: Home              Anticipated d/c is to: Home              Patient currently is not medically stable to d/c.   Difficult to place patient No   Zada Finders MD [facility]  If 7PM-7AM, please contact night-coverage [email or web address]  01/23/2021, 9:25 PM

## 2021-01-23 NOTE — ED Triage Notes (Signed)
Patient arrived stating he was discharged today at 4pm diagnosed with pancreatitis and told to come back if pain gets worse. States his pharmacy was closed and now his pain is back

## 2021-01-23 NOTE — ED Notes (Signed)
Pt vomited in trash can, trash bag removed and pt given emesis bags; pt given warm blanket

## 2021-01-23 NOTE — ED Triage Notes (Addendum)
Patient c/o generalized body pain. Patient c/o upper abdominal pain that radiates to the back causing "sharp pain in his spine" and left mid back area. Reports dark colored urine.   Patient reports he was told he had GERD X2 years ago.   Patient reports " bubbling" in epigastric region.   C/o nausea this morning and unable to tolerate PO.   8/10 pain  Patient reports taking tums lastnight with no relief.   A/ox4 Ambulatory in triage

## 2021-01-24 DIAGNOSIS — K859 Acute pancreatitis without necrosis or infection, unspecified: Secondary | ICD-10-CM | POA: Diagnosis not present

## 2021-01-24 DIAGNOSIS — K858 Other acute pancreatitis without necrosis or infection: Secondary | ICD-10-CM | POA: Diagnosis present

## 2021-01-24 DIAGNOSIS — R1013 Epigastric pain: Secondary | ICD-10-CM | POA: Diagnosis present

## 2021-01-24 DIAGNOSIS — K852 Alcohol induced acute pancreatitis without necrosis or infection: Secondary | ICD-10-CM | POA: Diagnosis present

## 2021-01-24 DIAGNOSIS — K219 Gastro-esophageal reflux disease without esophagitis: Secondary | ICD-10-CM | POA: Diagnosis present

## 2021-01-24 DIAGNOSIS — F1010 Alcohol abuse, uncomplicated: Secondary | ICD-10-CM | POA: Diagnosis present

## 2021-01-24 DIAGNOSIS — Z20822 Contact with and (suspected) exposure to covid-19: Secondary | ICD-10-CM | POA: Diagnosis present

## 2021-01-24 LAB — CBC
HCT: 46.4 % (ref 39.0–52.0)
Hemoglobin: 15.6 g/dL (ref 13.0–17.0)
MCH: 31.1 pg (ref 26.0–34.0)
MCHC: 33.6 g/dL (ref 30.0–36.0)
MCV: 92.6 fL (ref 80.0–100.0)
Platelets: 262 10*3/uL (ref 150–400)
RBC: 5.01 MIL/uL (ref 4.22–5.81)
RDW: 13.2 % (ref 11.5–15.5)
WBC: 10.2 10*3/uL (ref 4.0–10.5)
nRBC: 0 % (ref 0.0–0.2)

## 2021-01-24 LAB — COMPREHENSIVE METABOLIC PANEL
ALT: 19 U/L (ref 0–44)
AST: 22 U/L (ref 15–41)
Albumin: 4 g/dL (ref 3.5–5.0)
Alkaline Phosphatase: 46 U/L (ref 38–126)
Anion gap: 11 (ref 5–15)
BUN: 8 mg/dL (ref 6–20)
CO2: 22 mmol/L (ref 22–32)
Calcium: 9.2 mg/dL (ref 8.9–10.3)
Chloride: 102 mmol/L (ref 98–111)
Creatinine, Ser: 1.01 mg/dL (ref 0.61–1.24)
GFR, Estimated: >60 mL/min (ref >60)
Glucose, Bld: 136 mg/dL — ABNORMAL HIGH (ref 70–99)
Potassium: 4 mmol/L (ref 3.5–5.1)
Sodium: 135 mmol/L (ref 135–145)
Total Bilirubin: 1.6 mg/dL — ABNORMAL HIGH (ref 0.3–1.2)
Total Protein: 7.3 g/dL (ref 6.5–8.1)

## 2021-01-24 LAB — HIV ANTIBODY (ROUTINE TESTING W REFLEX): HIV Screen 4th Generation wRfx: NONREACTIVE

## 2021-01-24 LAB — LIPASE, BLOOD: Lipase: 1170 U/L — ABNORMAL HIGH (ref 11–51)

## 2021-01-24 MED ORDER — LIP MEDEX EX OINT
TOPICAL_OINTMENT | CUTANEOUS | Status: DC | PRN
  Filled 2021-01-24: qty 7

## 2021-01-24 MED ORDER — THIAMINE HCL 100 MG PO TABS
100.0000 mg | ORAL_TABLET | Freq: Every day | ORAL | Status: DC
  Administered 2021-01-24 – 2021-01-27 (×4): 100 mg via ORAL
  Filled 2021-01-24 (×4): qty 1

## 2021-01-24 MED ORDER — PANTOPRAZOLE SODIUM 40 MG PO TBEC
40.0000 mg | DELAYED_RELEASE_TABLET | Freq: Every day | ORAL | Status: DC
  Administered 2021-01-24 – 2021-01-25 (×2): 40 mg via ORAL
  Filled 2021-01-24 (×2): qty 1

## 2021-01-24 MED ORDER — FOLIC ACID 1 MG PO TABS
1.0000 mg | ORAL_TABLET | Freq: Every day | ORAL | Status: DC
  Administered 2021-01-24 – 2021-01-27 (×4): 1 mg via ORAL
  Filled 2021-01-24 (×4): qty 1

## 2021-01-24 MED ORDER — HYDRALAZINE HCL 20 MG/ML IJ SOLN
10.0000 mg | Freq: Four times a day (QID) | INTRAMUSCULAR | Status: DC | PRN
  Administered 2021-01-24 – 2021-01-25 (×2): 10 mg via INTRAVENOUS
  Filled 2021-01-24 (×3): qty 1

## 2021-01-24 NOTE — Plan of Care (Signed)

## 2021-01-24 NOTE — Progress Notes (Signed)
PROGRESS NOTE    Mosi Hannold  UGQ:[id number] DOB: October 24, 1995 DOA: 01/23/2021 PCP: Pcp, No   Chief Complain: Abdominal pain  Brief Narrative:  Patient is a 25 year old male with history of alcohol abuse who presented to the emergency department with complaints of abdominal pain.  He reported upper abdominal pain with radiation to his back, associated with nausea, vomiting and poor oral intake.  He presented to the emergency department earlier on 6/28 and was diagnosed with pancreatitis on CT imaging with elevated lipase, treated with antiemetics/analgesics and was discharged home however came back with severe epigastric discomfort.  He consumes 1 pint of liquor every day, occasionally takes marijuana.  Patient was admitted for the management of acute pancreatitis  Assessment & Plan:   Principal Problem:   Acute pancreatitis without infection or necrosis, unspecified pancreatitis type   Acute pancreatitis: Likely alcohol associated .  CT imaging did not show any evidence of pancreatic necrosis or abscess but showed interstitial edematous changes.  Gallbladder appears normal without any stones or dilated CBD.  Continue IV fluids, analgesics, antiemetics.  His abdomen pain is better today.  We will start on clear liquid diet, will advance the diet as tolerated.  Chronic alcohol abuse: Currently not in withdrawal.  Consumes 1 pint  of liquor every day.  Counseled cessation.  Continue thiamine and folic acid.         DVT prophylaxis:Lovenox Code Status: Full Family Communication: Discussed with aunt on phone on 6/29 Status is: Observation  The patient remains OBS appropriate and will d/c before 2 midnights.  Dispo: The patient is from: Home              Anticipated d/c is to: Home              Patient currently is not medically stable to d/c.   Difficult to place patient No    Consultants: None  Procedures:None  Antimicrobials:  Anti-infectives (From admission, onward)     None       Subjective:  Patient seen and examined the bedside this morning.  Hemodynamically stable.  He feels better today.  No nausea or vomiting.  Abdominal pain is better.  Eager to start on diet.   Objective: Vitals:   01/23/21 2345 01/24/21 0030 01/24/21 0316 01/24/21 0510  BP: (!) 163/112 (!) 165/87  (!) 153/91  Pulse: 68 79  74  Resp: 20 18  17   Temp: 97.7 F (36.5 C) 98.3 F (36.8 C)  98.5 F (36.9 C)  TempSrc: Oral   Oral  SpO2: 96% 100%  98%  Weight:   64.4 kg   Height:   5\' 8"  (1.727 m)     Intake/Output Summary (Last 24 hours) at 01/24/2021 0744 Last data filed at 01/24/2021 0300 Gross per 24 hour  Intake 1535.42 ml  Output --  Net 1535.42 ml   Filed Weights   01/24/21 0316  Weight: 64.4 kg    Examination:  General exam: Appears calm and comfortable ,Not in distress,average built HEENT:PERRL,Oral mucosa moist, Ear/Nose normal on gross exam Respiratory system: Bilateral equal air entry, normal vesicular breath sounds, no wheezes or crackles  Cardiovascular system: S1 & S2 heard, RRR. No JVD, murmurs, rubs, gallops or clicks. No pedal edema. Gastrointestinal system: Abdomen is nondistended, soft and has tenderness on the epigastric region. No organomegaly or masses felt. Normal bowel sounds heard. Central nervous system: Alert and oriented. No focal neurological deficits. Extremities: No edema, no clubbing ,no cyanosis, distal peripheral pulses palpable.  Skin: No rashes, lesions or ulcers,no icterus ,no pallor   Data Reviewed: I have personally reviewed following labs and imaging studies  CBC: Recent Labs  Lab 01/23/21 1150 01/23/21 2004 01/24/21 0425  WBC 9.4 10.7* 10.2  NEUTROABS  --  9.0*  --   HGB 14.9 15.9 15.6  HCT 44.3 46.4 46.4  MCV 92.3 91.3 92.6  PLT 292 307 262   Basic Metabolic Panel: Recent Labs  Lab 01/23/21 1150 01/23/21 2004 01/24/21 0425  NA 137 136 135  K 3.7 3.7 4.0  CL 98 95* 102  CO2 27 27 22   GLUCOSE 147* 150*  136*  BUN 10 9 8   CREATININE 1.23 1.22 1.01  CALCIUM 10.1 10.2 9.2   GFR: Estimated Creatinine Clearance: 103.6 mL/min (by C-G formula based on SCr of 1.01 mg/dL). Liver Function Tests: Recent Labs  Lab 01/23/21 1150 01/23/21 2004 01/24/21 0425  AST 28 27 22   ALT 24 22 19   ALKPHOS 54 55 46  BILITOT 1.6* 1.6* 1.6*  PROT 8.4* 8.3* 7.3  ALBUMIN 4.8 4.8 4.0   Recent Labs  Lab 01/23/21 1150 01/23/21 2004  LIPASE 314* 1,027*   No results for input(s): AMMONIA in the last 168 hours. Coagulation Profile: No results for input(s): INR, PROTIME in the last 168 hours. Cardiac Enzymes: No results for input(s): CKTOTAL, CKMB, CKMBINDEX, TROPONINI in the last 168 hours. BNP (last 3 results) No results for input(s): PROBNP in the last 8760 hours. HbA1C: No results for input(s): HGBA1C in the last 72 hours. CBG: No results for input(s): GLUCAP in the last 168 hours. Lipid Profile: No results for input(s): CHOL, HDL, LDLCALC, TRIG, CHOLHDL, LDLDIRECT in the last 72 hours. Thyroid Function Tests: No results for input(s): TSH, T4TOTAL, FREET4, T3FREE, THYROIDAB in the last 72 hours. Anemia Panel: No results for input(s): VITAMINB12, FOLATE, FERRITIN, TIBC, IRON, RETICCTPCT in the last 72 hours. Sepsis Labs: No results for input(s): PROCALCITON, LATICACIDVEN in the last 168 hours.  Recent Results (from the past 240 hour(s))  Resp Panel by RT-PCR (Flu A&B, Covid) Nasopharyngeal Swab     Status: None   Collection Time: 01/23/21  8:05 PM   Specimen: Nasopharyngeal Swab; Nasopharyngeal(NP) swabs in vial transport medium  Result Value Ref Range Status   SARS Coronavirus 2 by RT PCR NEGATIVE NEGATIVE Final    Comment: (NOTE) SARS-CoV-2 target nucleic acids are NOT DETECTED.  The SARS-CoV-2 RNA is generally detectable in upper respiratory specimens during the acute phase of infection. The lowest concentration of SARS-CoV-2 viral copies this assay can detect is 138 copies/mL. A negative  result does not preclude SARS-Cov-2 infection and should not be used as the sole basis for treatment or other patient management decisions. A negative result may occur with  improper specimen collection/handling, submission of specimen other than nasopharyngeal swab, presence of viral mutation(s) within the areas targeted by this assay, and inadequate number of viral copies(<138 copies/mL). A negative result must be combined with clinical observations, patient history, and epidemiological information. The expected result is Negative.  Fact Sheet for Patients:   Fact Sheet for Healthcare Providers:  01/25/21  This test is no t yet approved or cleared by the 01/25/21 FDA and  has been authorized for detection and/or diagnosis of SARS-CoV-2 by FDA under an Emergency Use Authorization (EUA). This EUA will remain  in effect (meaning this test can be used) for the duration of the COVID-19 declaration under Section 564(b)(1) of the Act, 21 U.S.C.section 360bbb-3(b)(1), unless the  authorization is terminated  or revoked sooner.       Influenza A by PCR NEGATIVE NEGATIVE Final   Influenza B by PCR NEGATIVE NEGATIVE Final    Comment: (NOTE) The Xpert Xpress SARS-CoV-2/FLU/RSV plus assay is intended as an aid in the diagnosis of influenza from Nasopharyngeal swab specimens and should not be used as a sole basis for treatment. Nasal washings and aspirates are unacceptable for Xpert Xpress SARS-CoV-2/FLU/RSV testing.  Fact Sheet for Patients: BloggerCourse.com  Fact Sheet for Healthcare Providers: SeriousBroker.it  This test is not yet approved or cleared by the [place] FDA and has been authorized for detection and/or diagnosis of SARS-CoV-2 by FDA under an Emergency Use Authorization (EUA). This EUA will remain in effect (meaning this test can be used)  for the duration of the COVID-19 declaration under Section 564(b)(1) of the Act, 21 U.S.C. section 360bbb-3(b)(1), unless the authorization is terminated or revoked.  Performed at [facility], [address] [place], [place] [address]          Radiology Studies: CT ABDOMEN PELVIS W CONTRAST  Result Date: 01/23/2021 CLINICAL DATA:  Severe epigastric pain, concern for pancreatitis EXAM: CT ABDOMEN AND PELVIS WITH CONTRAST TECHNIQUE: Multidetector CT imaging of the abdomen and pelvis was performed using the standard protocol following bolus administration of intravenous contrast. CONTRAST:  OMNIPAQUE IOHEXOL 300 MG/ML  SOLN COMPARISON:  None. FINDINGS: Lower chest: Lung bases are clear. Normal heart size. No pericardial effusion. Hepatobiliary: No worrisome focal liver abnormality is seen. Normal gallbladder. No visible calcified gallstones. No biliary ductal dilatation. Pancreas: Diffusely edematous appearance of the pancreas with some slight heterogeneity towards the pancreatic tail. Surrounding edematous peripancreatic fluid is noted. No large regions of necrosis. No pancreatic ductal dilatation. No organized abscess or collection. Spleen: Normal in size. No concerning splenic lesions. Adrenals/Urinary Tract: Normal adrenals. Kidneys are normally located with symmetric enhancement. No suspicious renal lesion, urolithiasis or hydronephrosis. Urinary bladder is largely decompressed at the time of exam and therefore poorly evaluated by CT imaging. Stomach/Bowel: Distal esophagus and stomach are unremarkable. Thickened and heterogeneous appearance of the duodenal sweep, particularly towards the head and uncinate of the pancreas may be reactive duodenitis. No other small bowel thickening or dilatation. Some mild edematous changes are seen along the colon, particularly within the retroperitoneal segments which may be reactive to the peripancreatic inflammatory change. Normal  appendix. No evidence of obstruction. Vascular/Lymphatic: No significant vascular findings are present. No enlarged abdominal or pelvic lymph nodes. Reproductive: The prostate and seminal vesicles are unremarkable. Other: With peripancreatic edematous fluid also with fluid tracking in the retroperitoneum and along the right pericolic gutter. No free air. No bowel containing hernia. Musculoskeletal: No acute osseous abnormality or suspicious osseous lesion. IMPRESSION: 1. Features most consistent with an acute interstitial edematous pancreatitis. Some slight heterogeneity of the pancreatic tail is more nonspecific without large region pancreatic necrosis or organized collection at this time. Adjacent edematous features and likely reactive fluid throughout the retroperitoneum and tracking into right pericolic gutter. Recommend correlation with lipase. 2. Thickening and heterogeneity of the duodenal sweep, particularly the 2nd to 3rd portion as it crosses adjacent the duodenal head and uncinate is favored to be reactive. Additional likely reactive edematous thickening of the colon as well. Could correlate with clinical assessment of bowel symptoms Electronically Signed   By: Kreg Shropshire M.D.   On: 01/23/2021 15:18        Scheduled Meds:  enoxaparin (LOVENOX) injection  40 mg Subcutaneous  Q24H   sodium chloride flush  3 mL Intravenous Q12H   Continuous Infusions:  lactated ringers 125 mL/hr at 01/23/21 2243     LOS: 0 days    Time spent:35 mins. More than 50% of that time was spent in counseling and/or coordination of care.      Burnadette PopAmrit Chauntel Windsor, MD [facility] P6/29/2022, 7:44 AM

## 2021-01-25 LAB — BASIC METABOLIC PANEL
Anion gap: 10 (ref 5–15)
BUN: 5 mg/dL — ABNORMAL LOW (ref 6–20)
CO2: 28 mmol/L (ref 22–32)
Calcium: 9 mg/dL (ref 8.9–10.3)
Chloride: 96 mmol/L — ABNORMAL LOW (ref 98–111)
Creatinine, Ser: 0.94 mg/dL (ref 0.61–1.24)
GFR, Estimated: >60 mL/min (ref >60)
Glucose, Bld: 110 mg/dL — ABNORMAL HIGH (ref 70–99)
Potassium: 3.7 mmol/L (ref 3.5–5.1)
Sodium: 134 mmol/L — ABNORMAL LOW (ref 135–145)

## 2021-01-25 LAB — HEMOGLOBIN A1C
Hgb A1c MFr Bld: 5.5 % (ref 4.8–5.6)
Mean Plasma Glucose: 111 mg/dL

## 2021-01-25 LAB — LIPASE, BLOOD: Lipase: 1107 U/L — ABNORMAL HIGH (ref 11–51)

## 2021-01-25 MED ORDER — SODIUM CHLORIDE 0.9 % IV SOLN: INTRAVENOUS | Status: DC

## 2021-01-25 MED ORDER — PANTOPRAZOLE SODIUM 40 MG IV SOLR
40.0000 mg | INTRAVENOUS | Status: DC
  Administered 2021-01-26 – 2021-01-27 (×2): 40 mg via INTRAVENOUS
  Filled 2021-01-25 (×3): qty 40

## 2021-01-25 MED ORDER — HYDROMORPHONE HCL 1 MG/ML IJ SOLN
0.5000 mg | INTRAMUSCULAR | Status: DC | PRN
  Administered 2021-01-25 – 2021-01-27 (×14): 0.5 mg via INTRAVENOUS
  Filled 2021-01-25 (×14): qty 0.5

## 2021-01-25 MED ORDER — ALUM & MAG HYDROXIDE-SIMETH 200-200-20 MG/5ML PO SUSP
30.0000 mL | Freq: Four times a day (QID) | ORAL | Status: DC | PRN
  Administered 2021-01-25: 30 mL via ORAL
  Filled 2021-01-25: qty 30

## 2021-01-25 MED ORDER — LACTATED RINGERS IV SOLN: INTRAVENOUS | Status: DC

## 2021-01-25 NOTE — Progress Notes (Signed)
PROGRESS NOTE    Garrett PalmsMichael Kane  ZOX:[id number] DOB: 07/11/1996 DOA: 01/23/2021 PCP: Pcp, No   Chief Complain: Abdominal pain  Brief Narrative:  Patient is a 25 year old male with history of alcohol abuse who presented to the emergency department with complaints of abdominal pain.  He reported upper abdominal pain with radiation to his back, associated with nausea, vomiting and poor oral intake.  He presented to the emergency department earlier on 6/28 and was diagnosed with pancreatitis on CT imaging with elevated lipase, treated with antiemetics/analgesics and was discharged home however came back with severe epigastric discomfort.  He consumes 1 pint of liquor every day, occasionally takes marijuana.  Patient was admitted for the management of acute pancreatitis  Assessment & Plan:   Principal Problem:   Acute pancreatitis without infection or necrosis, unspecified pancreatitis type Active Problems:   Acute pancreatitis   Acute pancreatitis: Likely alcohol associated .  CT imaging did not show any evidence of pancreatic necrosis or abscess but showed interstitial edematous changes.  Gallbladder appears normal without any stones or dilated CBD. Continue IV fluids, analgesics, antiemetics.    Lipase remains elevated. Patient continues to complain of bloating, abdominal discomfort today so we will keep him n.p.o.  Chronic alcohol abuse: Currently not in withdrawal.  Consumes 1 pint  of liquor every day.  Counseled cessation.  Continue thiamine and folic acid.         DVT prophylaxis:Lovenox Code Status: Full Family Communication: Discussed with aunt on phone on 6/29 Status is: Observation  The patient remains OBS appropriate and will d/c before 2 midnights.  Dispo: The patient is from: Home              Anticipated d/c is to: Home              Patient currently is not medically stable to d/c.   Difficult to place patient No    Consultants:  None  Procedures:None  Antimicrobials:  Anti-infectives (From admission, onward)    None       Subjective:  Patient seen and examined the bedside this morning.  Hemodynamically stable.  Complains of abdominal distention, bloating but does not complain of worsening epigastric pain.  No nausea or vomiting.   Objective: Vitals:   01/24/21 2145 01/25/21 0143 01/25/21 0254 01/25/21 0546  BP: (!) 148/112 (!) 146/110 (!) 142/92 (!) 153/100  Pulse: (!) 104 90  98  Resp: 18 17  18   Temp: 99.4 F (37.4 C) 99.4 F (37.4 C)  99 F (37.2 C)  TempSrc: Oral Oral  Oral  SpO2: 100% 98%  100%  Weight:      Height:        Intake/Output Summary (Last 24 hours) at 01/25/2021 0747 Last data filed at 01/25/2021 0545 Gross per 24 hour  Intake 1400 ml  Output 325 ml  Net 1075 ml   Filed Weights   01/24/21 0316  Weight: 64.4 kg    Examination:  General exam: Overall comfortable, not in distress HEENT: PERRL Respiratory system:  no wheezes or crackles  Cardiovascular system: S1 & S2 heard, RRR.  Gastrointestinal system: Abdomen is mildly distended, soft and mildly tender in epigastric region.  Bowel sounds heard Central nervous system: Alert and oriented Extremities: No edema, no clubbing ,no cyanosis Skin: No rashes, no ulcers,no icterus     Data Reviewed: I have personally reviewed following labs and imaging studies  CBC: Recent Labs  Lab 01/23/21 1150 01/23/21 2004 01/24/21 0425  WBC 9.4  10.7* 10.2  NEUTROABS  --  9.0*  --   HGB 14.9 15.9 15.6  HCT 44.3 46.4 46.4  MCV 92.3 91.3 92.6  PLT 292 307 262   Basic Metabolic Panel: Recent Labs  Lab 01/23/21 1150 01/23/21 2004 01/24/21 0425 01/25/21 0417  NA 137 136 135 134*  K 3.7 3.7 4.0 3.7  CL 98 95* 102 96*  CO2 27 27 22 28   GLUCOSE 147* 150* 136* 110*  BUN 10 9 8  5*  CREATININE 1.23 1.22 1.01 0.94  CALCIUM 10.1 10.2 9.2 9.0   GFR: Estimated Creatinine Clearance: 111.3 mL/min (by C-G formula based on SCr of  0.94 mg/dL). Liver Function Tests: Recent Labs  Lab 01/23/21 1150 01/23/21 2004 01/24/21 0425  AST 28 27 22   ALT 24 22 19   ALKPHOS 54 55 46  BILITOT 1.6* 1.6* 1.6*  PROT 8.4* 8.3* 7.3  ALBUMIN 4.8 4.8 4.0   Recent Labs  Lab 01/23/21 1150 01/23/21 2004 01/24/21 0425 01/25/21 0417  LIPASE 314* 1,027* 1,170* 1,107*   No results for input(s): AMMONIA in the last 168 hours. Coagulation Profile: No results for input(s): INR, PROTIME in the last 168 hours. Cardiac Enzymes: No results for input(s): CKTOTAL, CKMB, CKMBINDEX, TROPONINI in the last 168 hours. BNP (last 3 results) No results for input(s): PROBNP in the last 8760 hours. HbA1C: Recent Labs    01/24/21 0425  HGBA1C 5.5   CBG: No results for input(s): GLUCAP in the last 168 hours. Lipid Profile: No results for input(s): CHOL, HDL, LDLCALC, TRIG, CHOLHDL, LDLDIRECT in the last 72 hours. Thyroid Function Tests: No results for input(s): TSH, T4TOTAL, FREET4, T3FREE, THYROIDAB in the last 72 hours. Anemia Panel: No results for input(s): VITAMINB12, FOLATE, FERRITIN, TIBC, IRON, RETICCTPCT in the last 72 hours. Sepsis Labs: No results for input(s): PROCALCITON, LATICACIDVEN in the last 168 hours.  Recent Results (from the past 240 hour(s))  Resp Panel by RT-PCR (Flu A&B, Covid) Nasopharyngeal Swab     Status: None   Collection Time: 01/23/21  8:05 PM   Specimen: Nasopharyngeal Swab; Nasopharyngeal(NP) swabs in vial transport medium  Result Value Ref Range Status   SARS Coronavirus 2 by RT PCR NEGATIVE NEGATIVE Final    Comment: (NOTE) SARS-CoV-2 target nucleic acids are NOT DETECTED.  The SARS-CoV-2 RNA is generally detectable in upper respiratory specimens during the acute phase of infection. The lowest concentration of SARS-CoV-2 viral copies this assay can detect is 138 copies/mL. A negative result does not preclude SARS-Cov-2 infection and should not be used as the sole basis for treatment or other patient  management decisions. A negative result may occur with  improper specimen collection/handling, submission of specimen other than nasopharyngeal swab, presence of viral mutation(s) within the areas targeted by this assay, and inadequate number of viral copies(<138 copies/mL). A negative result must be combined with clinical observations, patient history, and epidemiological information. The expected result is Negative.  Fact Sheet for Patients:  01/26/21  Fact Sheet for Healthcare Providers:  01/27/21  This test is no t yet approved or cleared by the 01/26/21 FDA and  has been authorized for detection and/or diagnosis of SARS-CoV-2 by FDA under an Emergency Use Authorization (EUA). This EUA will remain  in effect (meaning this test can be used) for the duration of the COVID-19 declaration under Section 564(b)(1) of the Act, 21 U.S.C.section 360bbb-3(b)(1), unless the authorization is terminated  or revoked sooner.       Influenza A by PCR NEGATIVE NEGATIVE  Final   Influenza B by PCR NEGATIVE NEGATIVE Final    Comment: (NOTE) The Xpert Xpress SARS-CoV-2/FLU/RSV plus assay is intended as an aid in the diagnosis of influenza from Nasopharyngeal swab specimens and should not be used as a sole basis for treatment. Nasal washings and aspirates are unacceptable for Xpert Xpress SARS-CoV-2/FLU/RSV testing.  Fact Sheet for Patients: BloggerCourse.com  Fact Sheet for Healthcare Providers: SeriousBroker.it  This test is not yet approved or cleared by the [place] FDA and has been authorized for detection and/or diagnosis of SARS-CoV-2 by FDA under an Emergency Use Authorization (EUA). This EUA will remain in effect (meaning this test can be used) for the duration of the COVID-19 declaration under Section 564(b)(1) of the Act, 21 U.S.C. section 360bbb-3(b)(1),  unless the authorization is terminated or revoked.  Performed at [facility], [address] [place], [place] [address]          Radiology Studies: CT ABDOMEN PELVIS W CONTRAST  Result Date: 01/23/2021 CLINICAL DATA:  Severe epigastric pain, concern for pancreatitis EXAM: CT ABDOMEN AND PELVIS WITH CONTRAST TECHNIQUE: Multidetector CT imaging of the abdomen and pelvis was performed using the standard protocol following bolus administration of intravenous contrast. CONTRAST:  OMNIPAQUE IOHEXOL 300 MG/ML  SOLN COMPARISON:  None. FINDINGS: Lower chest: Lung bases are clear. Normal heart size. No pericardial effusion. Hepatobiliary: No worrisome focal liver abnormality is seen. Normal gallbladder. No visible calcified gallstones. No biliary ductal dilatation. Pancreas: Diffusely edematous appearance of the pancreas with some slight heterogeneity towards the pancreatic tail. Surrounding edematous peripancreatic fluid is noted. No large regions of necrosis. No pancreatic ductal dilatation. No organized abscess or collection. Spleen: Normal in size. No concerning splenic lesions. Adrenals/Urinary Tract: Normal adrenals. Kidneys are normally located with symmetric enhancement. No suspicious renal lesion, urolithiasis or hydronephrosis. Urinary bladder is largely decompressed at the time of exam and therefore poorly evaluated by CT imaging. Stomach/Bowel: Distal esophagus and stomach are unremarkable. Thickened and heterogeneous appearance of the duodenal sweep, particularly towards the head and uncinate of the pancreas may be reactive duodenitis. No other small bowel thickening or dilatation. Some mild edematous changes are seen along the colon, particularly within the retroperitoneal segments which may be reactive to the peripancreatic inflammatory change. Normal appendix. No evidence of obstruction. Vascular/Lymphatic: No significant vascular findings are present. No enlarged  abdominal or pelvic lymph nodes. Reproductive: The prostate and seminal vesicles are unremarkable. Other: With peripancreatic edematous fluid also with fluid tracking in the retroperitoneum and along the right pericolic gutter. No free air. No bowel containing hernia. Musculoskeletal: No acute osseous abnormality or suspicious osseous lesion. IMPRESSION: 1. Features most consistent with an acute interstitial edematous pancreatitis. Some slight heterogeneity of the pancreatic tail is more nonspecific without large region pancreatic necrosis or organized collection at this time. Adjacent edematous features and likely reactive fluid throughout the retroperitoneum and tracking into right pericolic gutter. Recommend correlation with lipase. 2. Thickening and heterogeneity of the duodenal sweep, particularly the 2nd to 3rd portion as it crosses adjacent the duodenal head and uncinate is favored to be reactive. Additional likely reactive edematous thickening of the colon as well. Could correlate with clinical assessment of bowel symptoms Electronically Signed   By: Kreg Shropshire M.D.   On: 01/23/2021 15:18        Scheduled Meds:  enoxaparin (LOVENOX) injection  40 mg Subcutaneous Q24H   folic acid  1 mg Oral Daily   pantoprazole  40 mg Oral Daily  sodium chloride flush  3 mL Intravenous Q12H   thiamine  100 mg Oral Daily   Continuous Infusions:     LOS: 1 day    Time spent:35 mins. More than 50% of that time was spent in counseling and/or coordination of care.      Burnadette Pop, MD [facility] P6/30/2022, 7:47 AM

## 2021-01-25 NOTE — Plan of Care (Signed)
  Problem: Coping: Goal: Level of anxiety will decrease 01/25/2021 0202 by Kizzie Bane, RN Outcome: Progressing 01/25/2021 0202 by Kizzie Bane, RN Outcome: Progressing   Problem: Pain Managment: Goal: General experience of comfort will improve Outcome: Progressing

## 2021-01-26 LAB — BASIC METABOLIC PANEL
Anion gap: 13 (ref 5–15)
BUN: 6 mg/dL (ref 6–20)
CO2: 26 mmol/L (ref 22–32)
Calcium: 8.8 mg/dL — ABNORMAL LOW (ref 8.9–10.3)
Chloride: 96 mmol/L — ABNORMAL LOW (ref 98–111)
Creatinine, Ser: 0.82 mg/dL (ref 0.61–1.24)
GFR, Estimated: >60 mL/min (ref >60)
Glucose, Bld: 85 mg/dL (ref 70–99)
Potassium: 3.5 mmol/L (ref 3.5–5.1)
Sodium: 135 mmol/L (ref 135–145)

## 2021-01-26 LAB — LIPASE, BLOOD: Lipase: 451 U/L — ABNORMAL HIGH (ref 11–51)

## 2021-01-26 NOTE — Progress Notes (Signed)
PROGRESS NOTE    Garrett Kane  PPI:[id number] DOB: 1996-01-04 DOA: 01/23/2021 PCP: Pcp, No   Chief Complain: Abdominal pain  Brief Narrative:  Patient is a 25 year old male with history of alcohol abuse who presented to the emergency department with complaints of abdominal pain.  He reported upper abdominal pain with radiation to his back, associated with nausea, vomiting and poor oral intake.  He presented to the emergency department earlier on 6/28 and was diagnosed with pancreatitis on CT imaging with elevated lipase, treated with antiemetics/analgesics and was discharged home however came back with severe epigastric discomfort.  He consumes 1 pint of liquor every day, occasionally takes marijuana.  Patient was admitted for the management of acute pancreatitis  Assessment & Plan:   Principal Problem:   Acute pancreatitis without infection or necrosis, unspecified pancreatitis type Active Problems:   Acute pancreatitis   Acute pancreatitis: Likely alcohol associated .  CT imaging did not show any evidence of pancreatic necrosis or abscess but showed interstitial edematous changes.  Gallbladder appears normal without any stones or dilated CBD. Continue IV fluids, analgesics, antiemetics.    Lipase improving now.  We will taper IV fluids Started on full liquid diet today, will continue to advance as tolerated.  Chronic alcohol abuse: Currently not in withdrawal.  Consumes 1 pint  of liquor every day.  Counseled cessation.  Continue thiamine and folic acid.         DVT prophylaxis:Lovenox Code Status: Full Family Communication: Discussed with aunt on phone on 6/29 Status is: Observation  The patient remains OBS appropriate and will d/c before 2 midnights.  Dispo: The patient is from: Home              Anticipated d/c is to: Home              Patient currently is not medically stable to d/c.   Difficult to place patient No    Consultants:  None  Procedures:None  Antimicrobials:  Anti-infectives (From admission, onward)    None       Subjective:  Patient seen and examined the bedside this morning.  Hemodynamically stable.  His abdominal pain is better today.  Did not report of any nausea or vomiting.  He feels better.  Ready to start the diet.   Objective: Vitals:   01/25/21 1106 01/25/21 1329 01/25/21 2130 01/26/21 0531  BP: (!) 147/92 (!) 143/93 133/84 (!) 129/100  Pulse: (!) 101 (!) 107 (!) 107 97  Resp:  18 18 17   Temp: 98.9 F (37.2 C) 98.9 F (37.2 C) 99.4 F (37.4 C) 98.6 F (37 C)  TempSrc: Oral Oral Oral   SpO2: 100% 100% 99% 100%  Weight:      Height:        Intake/Output Summary (Last 24 hours) at 01/26/2021 0828 Last data filed at 01/26/2021 0155 Gross per 24 hour  Intake 727.5 ml  Output 850 ml  Net -122.5 ml   Filed Weights   01/24/21 0316  Weight: 64.4 kg    Examination:  General exam: Overall comfortable, not in distress HEENT: PERRL Respiratory system:  no wheezes or crackles  Cardiovascular system: S1 & S2 heard, RRR.  Gastrointestinal system: Abdomen is mildly distended, soft and mildly tender in epigastric region.  Bowel sounds heard Central nervous system: Alert and oriented Extremities: No edema, no clubbing ,no cyanosis Skin: No rashes, no ulcers,no icterus     Data Reviewed: I have personally reviewed following labs and imaging studies  CBC: Recent Labs  Lab 01/23/21 1150 01/23/21 2004 01/24/21 0425  WBC 9.4 10.7* 10.2  NEUTROABS  --  9.0*  --   HGB 14.9 15.9 15.6  HCT 44.3 46.4 46.4  MCV 92.3 91.3 92.6  PLT 292 307 262   Basic Metabolic Panel: Recent Labs  Lab 01/23/21 1150 01/23/21 2004 01/24/21 0425 01/25/21 0417 01/26/21 0344  NA 137 136 135 134* 135  K 3.7 3.7 4.0 3.7 3.5  CL 98 95* 102 96* 96*  CO2 27 27 22 28 26   GLUCOSE 147* 150* 136* 110* 85  BUN 10 9 8  5* 6  CREATININE 1.23 1.22 1.01 0.94 0.82  CALCIUM 10.1 10.2 9.2 9.0 8.8*    GFR: Estimated Creatinine Clearance: 127.6 mL/min (by C-G formula based on SCr of 0.82 mg/dL). Liver Function Tests: Recent Labs  Lab 01/23/21 1150 01/23/21 2004 01/24/21 0425  AST 28 27 22   ALT 24 22 19   ALKPHOS 54 55 46  BILITOT 1.6* 1.6* 1.6*  PROT 8.4* 8.3* 7.3  ALBUMIN 4.8 4.8 4.0   Recent Labs  Lab 01/23/21 1150 01/23/21 2004 01/24/21 0425 01/25/21 0417 01/26/21 0344  LIPASE 314* 1,027* 1,170* 1,107* 451*   No results for input(s): AMMONIA in the last 168 hours. Coagulation Profile: No results for input(s): INR, PROTIME in the last 168 hours. Cardiac Enzymes: No results for input(s): CKTOTAL, CKMB, CKMBINDEX, TROPONINI in the last 168 hours. BNP (last 3 results) No results for input(s): PROBNP in the last 8760 hours. HbA1C: Recent Labs    01/24/21 0425  HGBA1C 5.5   CBG: No results for input(s): GLUCAP in the last 168 hours. Lipid Profile: No results for input(s): CHOL, HDL, LDLCALC, TRIG, CHOLHDL, LDLDIRECT in the last 72 hours. Thyroid Function Tests: No results for input(s): TSH, T4TOTAL, FREET4, T3FREE, THYROIDAB in the last 72 hours. Anemia Panel: No results for input(s): VITAMINB12, FOLATE, FERRITIN, TIBC, IRON, RETICCTPCT in the last 72 hours. Sepsis Labs: No results for input(s): PROCALCITON, LATICACIDVEN in the last 168 hours.  Recent Results (from the past 240 hour(s))  Resp Panel by RT-PCR (Flu A&B, Covid) Nasopharyngeal Swab     Status: None   Collection Time: 01/23/21  8:05 PM   Specimen: Nasopharyngeal Swab; Nasopharyngeal(NP) swabs in vial transport medium  Result Value Ref Range Status   SARS Coronavirus 2 by RT PCR NEGATIVE NEGATIVE Final    Comment: (NOTE) SARS-CoV-2 target nucleic acids are NOT DETECTED.  The SARS-CoV-2 RNA is generally detectable in upper respiratory specimens during the acute phase of infection. The lowest concentration of SARS-CoV-2 viral copies this assay can detect is 138 copies/mL. A negative result does  not preclude SARS-Cov-2 infection and should not be used as the sole basis for treatment or other patient management decisions. A negative result may occur with  improper specimen collection/handling, submission of specimen other than nasopharyngeal swab, presence of viral mutation(s) within the areas targeted by this assay, and inadequate number of viral copies(<138 copies/mL). A negative result must be combined with clinical observations, patient history, and epidemiological information. The expected result is Negative.  Fact Sheet for Patients:  01/27/21  Fact Sheet for Healthcare Providers:  03/29/21  This test is no t yet approved or cleared by the 01/26/21 FDA and  has been authorized for detection and/or diagnosis of SARS-CoV-2 by FDA under an Emergency Use Authorization (EUA). This EUA will remain  in effect (meaning this test can be used) for the duration of the COVID-19 declaration under Section 564(b)(1)  of the Act, 21 U.S.C.section 360bbb-3(b)(1), unless the authorization is terminated  or revoked sooner.       Influenza A by PCR NEGATIVE NEGATIVE Final   Influenza B by PCR NEGATIVE NEGATIVE Final    Comment: (NOTE) The Xpert Xpress SARS-CoV-2/FLU/RSV plus assay is intended as an aid in the diagnosis of influenza from Nasopharyngeal swab specimens and should not be used as a sole basis for treatment. Nasal washings and aspirates are unacceptable for Xpert Xpress SARS-CoV-2/FLU/RSV testing.  Fact Sheet for Patients: BloggerCourse.com  Fact Sheet for Healthcare Providers: SeriousBroker.it  This test is not yet approved or cleared by the [place] FDA and has been authorized for detection and/or diagnosis of SARS-CoV-2 by FDA under an Emergency Use Authorization (EUA). This EUA will remain in effect (meaning this test can be used) for the  duration of the COVID-19 declaration under Section 564(b)(1) of the Act, 21 U.S.C. section 360bbb-3(b)(1), unless the authorization is terminated or revoked.  Performed at [facility], [address], [place], [place] [address]          Radiology Studies: No results found.      Scheduled Meds:  enoxaparin (LOVENOX) injection  40 mg Subcutaneous Q24H   folic acid  1 mg Oral Daily   pantoprazole (PROTONIX) IV  40 mg Intravenous Q24H   sodium chloride flush  3 mL Intravenous Q12H   thiamine  100 mg Oral Daily   Continuous Infusions:  lactated ringers 150 mL/hr at 01/26/21 0354      LOS: 2 days    Time spent:35 mins. More than 50% of that time was spent in counseling and/or coordination of care.      Burnadette Pop, MD [facility] P7/07/2020, 8:28 AM

## 2021-01-27 MED ORDER — THIAMINE HCL 100 MG PO TABS: 100.0000 mg | ORAL_TABLET | Freq: Every day | ORAL | 1 refills | Status: AC

## 2021-01-27 MED ORDER — FOLIC ACID 1 MG PO TABS: 1.0000 mg | ORAL_TABLET | Freq: Every day | ORAL | 1 refills | Status: AC

## 2021-01-27 MED ORDER — PANTOPRAZOLE SODIUM 40 MG PO TBEC: 40.0000 mg | DELAYED_RELEASE_TABLET | Freq: Every day | ORAL | 0 refills | Status: DC

## 2021-01-27 MED ORDER — TRAMADOL HCL 50 MG PO TABS: 50.0000 mg | ORAL_TABLET | Freq: Four times a day (QID) | ORAL | 0 refills | Status: DC | PRN

## 2021-01-27 NOTE — Discharge Summary (Signed)
Physician Discharge Summary  Garrett Kane JAS:[id number] DOB: 23-Dec-1995 DOA: 01/23/2021  PCP: Pcp, No  Admit date: 01/23/2021 Discharge date: 01/27/2021  Admitted From: Home Disposition:  Home  Discharge Condition:Stable CODE STATUS:FULL Diet recommendation:  Regular  Brief/Interim Summary:  Patient is a 25 year old male with history of alcohol abuse who presented to the emergency department with complaints of abdominal pain.  He reported upper abdominal pain with radiation to his back, associated with nausea, vomiting and poor oral intake.  He presented to the emergency department earlier on 6/28 and was diagnosed with pancreatitis on CT imaging with elevated lipase, treated with antiemetics/analgesics and was discharged home however came back with severe epigastric discomfort.  He consumes 1 pint of liquor every day, occasionally takes marijuana.  Patient was admitted for the management of acute pancreatitis.  He was started on IV fluids.  His abdomen pain gradually improved.  Diet gradually advanced and he is tolerating solid food.  He is medically stable for discharge today.  Counseled for quitting alcohol.  Discharge Diagnoses:  Principal Problem:   Acute pancreatitis without infection or necrosis, unspecified pancreatitis type Active Problems:   Acute pancreatitis    Discharge Instructions  Discharge Instructions     Diet general   Complete by: As directed    Discharge instructions   Complete by: As directed    1)Please take prescribed medications as instructed 2)Please quit alcohol   Increase activity slowly   Complete by: As directed       Allergies as of 01/27/2021   No Known Allergies      Medication List     STOP taking these medications    ondansetron 4 MG disintegrating tablet Commonly known as: Zofran ODT       TAKE these medications    folic acid 1 MG tablet Commonly known as: FOLVITE Take 1 tablet (1 mg total) by mouth daily. Start taking on:  January 28, 2021   ibuprofen 200 MG tablet Commonly known as: ADVIL Take 400 mg by mouth every 6 (six) hours as needed.   pantoprazole 40 MG tablet Commonly known as: Protonix Take 1 tablet (40 mg total) by mouth daily.   thiamine 100 MG tablet Take 1 tablet (100 mg total) by mouth daily. Start taking on: January 28, 2021   traMADol 50 MG tablet Commonly known as: Ultram Take 1 tablet (50 mg total) by mouth every 6 (six) hours as needed.        No Known Allergies  Consultations: None   Procedures/Studies: CT ABDOMEN PELVIS W CONTRAST  Result Date: 01/23/2021 CLINICAL DATA:  Severe epigastric pain, concern for pancreatitis EXAM: CT ABDOMEN AND PELVIS WITH CONTRAST TECHNIQUE: Multidetector CT imaging of the abdomen and pelvis was performed using the standard protocol following bolus administration of intravenous contrast. CONTRAST:  OMNIPAQUE IOHEXOL 300 MG/ML  SOLN COMPARISON:  None. FINDINGS: Lower chest: Lung bases are clear. Normal heart size. No pericardial effusion. Hepatobiliary: No worrisome focal liver abnormality is seen. Normal gallbladder. No visible calcified gallstones. No biliary ductal dilatation. Pancreas: Diffusely edematous appearance of the pancreas with some slight heterogeneity towards the pancreatic tail. Surrounding edematous peripancreatic fluid is noted. No large regions of necrosis. No pancreatic ductal dilatation. No organized abscess or collection. Spleen: Normal in size. No concerning splenic lesions. Adrenals/Urinary Tract: Normal adrenals. Kidneys are normally located with symmetric enhancement. No suspicious renal lesion, urolithiasis or hydronephrosis. Urinary bladder is largely decompressed at the time of exam and therefore poorly evaluated by CT imaging.  Stomach/Bowel: Distal esophagus and stomach are unremarkable. Thickened and heterogeneous appearance of the duodenal sweep, particularly towards the head and uncinate of the pancreas may be reactive  duodenitis. No other small bowel thickening or dilatation. Some mild edematous changes are seen along the colon, particularly within the retroperitoneal segments which may be reactive to the peripancreatic inflammatory change. Normal appendix. No evidence of obstruction. Vascular/Lymphatic: No significant vascular findings are present. No enlarged abdominal or pelvic lymph nodes. Reproductive: The prostate and seminal vesicles are unremarkable. Other: With peripancreatic edematous fluid also with fluid tracking in the retroperitoneum and along the right pericolic gutter. No free air. No bowel containing hernia. Musculoskeletal: No acute osseous abnormality or suspicious osseous lesion. IMPRESSION: 1. Features most consistent with an acute interstitial edematous pancreatitis. Some slight heterogeneity of the pancreatic tail is more nonspecific without large region pancreatic necrosis or organized collection at this time. Adjacent edematous features and likely reactive fluid throughout the retroperitoneum and tracking into right pericolic gutter. Recommend correlation with lipase. 2. Thickening and heterogeneity of the duodenal sweep, particularly the 2nd to 3rd portion as it crosses adjacent the duodenal head and uncinate is favored to be reactive. Additional likely reactive edematous thickening of the colon as well. Could correlate with clinical assessment of bowel symptoms Electronically Signed   By: Kreg Shropshire M.D.   On: 01/23/2021 15:18      Subjective:  Patient seen and examined at the bedside this morning.  Hemodynamically stable for discharge today.  Discharge Exam: Vitals:   01/26/21 2139 01/27/21 0611  BP: (!) 144/95 (!) 140/98  Pulse: 81 89  Resp:  20  Temp:  99 F (37.2 C)  SpO2: 100% 100%   Vitals:   01/26/21 1354 01/26/21 2137 01/26/21 2139 01/27/21 0611  BP: (!) 130/96 (!) 135/101 (!) 144/95 (!) 140/98  Pulse: 85 88 81 89  Resp: 17 20  20   Temp: 98.9 F (37.2 C) 99.5 F (37.5  C)  99 F (37.2 C)  TempSrc: Oral Oral  Oral  SpO2: 100% 100% 100% 100%  Weight:      Height:        General: Pt is alert, awake, not in acute distress Cardiovascular: RRR, S1/S2 +, no rubs, no gallops Respiratory: CTA bilaterally, no wheezing, no rhonchi Abdominal: Soft, NT, ND, bowel sounds + Extremities: no edema, no cyanosis    The results of significant diagnostics from this hospitalization (including imaging, microbiology, ancillary and laboratory) are listed below for reference.     Microbiology: Recent Results (from the past 240 hour(s))  Resp Panel by RT-PCR (Flu A&B, Covid) Nasopharyngeal Swab     Status: None   Collection Time: 01/23/21  8:05 PM   Specimen: Nasopharyngeal Swab; Nasopharyngeal(NP) swabs in vial transport medium  Result Value Ref Range Status   SARS Coronavirus 2 by RT PCR NEGATIVE NEGATIVE Final    Comment: (NOTE) SARS-CoV-2 target nucleic acids are NOT DETECTED.  The SARS-CoV-2 RNA is generally detectable in upper respiratory specimens during the acute phase of infection. The lowest concentration of SARS-CoV-2 viral copies this assay can detect is 138 copies/mL. A negative result does not preclude SARS-Cov-2 infection and should not be used as the sole basis for treatment or other patient management decisions. A negative result may occur with  improper specimen collection/handling, submission of specimen other than nasopharyngeal swab, presence of viral mutation(s) within the areas targeted by this assay, and inadequate number of viral copies(<138 copies/mL). A negative result must be combined with clinical  observations, patient history, and epidemiological information. The expected result is Negative.  Fact Sheet for Patients:  BloggerCourse.com  Fact Sheet for Healthcare Providers:  SeriousBroker.it  This test is no t yet approved or cleared by the [place] FDA and  has been  authorized for detection and/or diagnosis of SARS-CoV-2 by FDA under an Emergency Use Authorization (EUA). This EUA will remain  in effect (meaning this test can be used) for the duration of the COVID-19 declaration under Section 564(b)(1) of the Act, 21 U.S.C.section 360bbb-3(b)(1), unless the authorization is terminated  or revoked sooner.       Influenza A by PCR NEGATIVE NEGATIVE Final   Influenza B by PCR NEGATIVE NEGATIVE Final    Comment: (NOTE) The Xpert Xpress SARS-CoV-2/FLU/RSV plus assay is intended as an aid in the diagnosis of influenza from Nasopharyngeal swab specimens and should not be used as a sole basis for treatment. Nasal washings and aspirates are unacceptable for Xpert Xpress SARS-CoV-2/FLU/RSV testing.  Fact Sheet for Patients: BloggerCourse.com  Fact Sheet for Healthcare Providers: SeriousBroker.it  This test is not yet approved or cleared by the [place] FDA and has been authorized for detection and/or diagnosis of SARS-CoV-2 by FDA under an Emergency Use Authorization (EUA). This EUA will remain in effect (meaning this test can be used) for the duration of the COVID-19 declaration under Section 564(b)(1) of the Act, 21 U.S.C. section 360bbb-3(b)(1), unless the authorization is terminated or revoked.  Performed at [facility], [address], [place], [place] [address]      Labs: BNP (last 3 results) No results for input(s): BNP in the last 8760 hours. Basic Metabolic Panel: Recent Labs  Lab 01/23/21 1150 01/23/21 2004 01/24/21 0425 01/25/21 0417 01/26/21 0344  NA 137 136 135 134* 135  K 3.7 3.7 4.0 3.7 3.5  CL 98 95* 102 96* 96*  CO2 GLUCOSE 147* 150* 136* 110* 85  BUN 5* 6  CREATININE 1.23 1.22 1.01 0.94 0.82  CALCIUM 10.1 10.2 9.2 9.0 8.8*   Liver Function Tests: Recent Labs  Lab 01/23/21 1150 01/23/21 2004 01/24/21 0425  AST ALT ALKPHOS 54 55 46  BILITOT 1.6* 1.6* 1.6*  PROT 8.4* 8.3* 7.3  ALBUMIN 4.8 4.8 4.0   Recent Labs  Lab 01/23/21 1150 01/23/21 2004 01/24/21 0425 01/25/21 0417 01/26/21 0344  LIPASE 314* 1,027* 1,170* 1,107* 451*   No results for input(s): AMMONIA in the last 168 hours. CBC: Recent Labs  Lab 01/23/21 1150 01/23/21 2004 01/24/21 0425  WBC 9.4 10.7* 10.2  NEUTROABS  --  9.0*  --   HGB 14.9 15.9 15.6  HCT 44.3 46.4 46.4  MCV 92.3 91.3 92.6  PLT 292 307 262   Cardiac Enzymes: No results for input(s): CKTOTAL, CKMB, CKMBINDEX, TROPONINI in the last 168 hours. BNP: Invalid input(s): POCBNP CBG: No results for input(s): GLUCAP in the last 168 hours. D-Dimer No results for input(s): DDIMER in the last 72 hours. Hgb A1c No results for input(s): HGBA1C in the last 72 hours. Lipid Profile No results for input(s): CHOL, HDL, LDLCALC, TRIG, CHOLHDL, LDLDIRECT in the last 72 hours. Thyroid function studies No results for input(s): TSH, T4TOTAL, T3FREE, THYROIDAB in the last 72 hours.  Invalid input(s): FREET3 Anemia work up No results for input(s): VITAMINB12, FOLATE, FERRITIN, TIBC, IRON, RETICCTPCT in the last 72 hours. Urinalysis    Component Value Date/Time  COLORURINE AMBER (A) 01/23/2021 1315   APPEARANCEUR HAZY (A) 01/23/2021 1315   LABSPEC 1.039 (H) 01/23/2021 1315   PHURINE 5.0 01/23/2021 1315   GLUCOSEU NEGATIVE 01/23/2021 1315   HGBUR MODERATE (A) 01/23/2021 1315   BILIRUBINUR SMALL (A) 01/23/2021 1315   KETONESUR 80 (A) 01/23/2021 1315   PROTEINUR >=300 (A) 01/23/2021 1315   NITRITE NEGATIVE 01/23/2021 1315   LEUKOCYTESUR NEGATIVE 01/23/2021 1315   Sepsis Labs Invalid input(s): PROCALCITONIN,  WBC,  LACTICIDVEN Microbiology Recent Results (from the past 240 hour(s))  Resp Panel by RT-PCR (Flu A&B, Covid) Nasopharyngeal Swab     Status: None   Collection Time: 01/23/21  8:05 PM   Specimen: Nasopharyngeal Swab; Nasopharyngeal(NP)  swabs in vial transport medium  Result Value Ref Range Status   SARS Coronavirus 2 by RT PCR NEGATIVE NEGATIVE Final    Comment: (NOTE) SARS-CoV-2 target nucleic acids are NOT DETECTED.  The SARS-CoV-2 RNA is generally detectable in upper respiratory specimens during the acute phase of infection. The lowest concentration of SARS-CoV-2 viral copies this assay can detect is 138 copies/mL. A negative result does not preclude SARS-Cov-2 infection and should not be used as the sole basis for treatment or other patient management decisions. A negative result may occur with  improper specimen collection/handling, submission of specimen other than nasopharyngeal swab, presence of viral mutation(s) within the areas targeted by this assay, and inadequate number of viral copies(<138 copies/mL). A negative result must be combined with clinical observations, patient history, and epidemiological information. The expected result is Negative.  Fact Sheet for Patients:  BloggerCourse.comhttps://www.fda.gov/media/152166/download  Fact Sheet for Healthcare Providers:  SeriousBroker.ithttps://www.fda.gov/media/152162/download  This test is no t yet approved or cleared by the [place] FDA and  has been authorized for detection and/or diagnosis of SARS-CoV-2 by FDA under an Emergency Use Authorization (EUA). This EUA will remain  in effect (meaning this test can be used) for the duration of the COVID-19 declaration under Section 564(b)(1) of the Act, 21 U.S.C.section 360bbb-3(b)(1), unless the authorization is terminated  or revoked sooner.       Influenza A by PCR NEGATIVE NEGATIVE Final   Influenza B by PCR NEGATIVE NEGATIVE Final    Comment: (NOTE) The Xpert Xpress SARS-CoV-2/FLU/RSV plus assay is intended as an aid in the diagnosis of influenza from Nasopharyngeal swab specimens and should not be used as a sole basis for treatment. Nasal washings and aspirates are unacceptable for Xpert Xpress  SARS-CoV-2/FLU/RSV testing.  Fact Sheet for Patients: BloggerCourse.comhttps://www.fda.gov/media/152166/download  Fact Sheet for Healthcare Providers: SeriousBroker.ithttps://www.fda.gov/media/152162/download  This test is not yet approved or cleared by the [place] FDA and has been authorized for detection and/or diagnosis of SARS-CoV-2 by FDA under an Emergency Use Authorization (EUA). This EUA will remain in effect (meaning this test can be used) for the duration of the COVID-19 declaration under Section 564(b)(1) of the Act, 21 U.S.C. section 360bbb-3(b)(1), unless the authorization is terminated or revoked.  Performed at [facility], [address] [place], [place] [id number]     Please note: You were cared for by a hospitalist during your hospital stay. Once you are discharged, your primary care physician will handle any further medical issues. Please note that NO REFILLS for any discharge medications will be authorized once you are discharged, as it is imperative that you return to your primary care physician (or establish a relationship with a primary care physician if you do not have one) for your post hospital discharge needs so that they can  reassess your need for medications and monitor your lab values.    Time coordinating discharge: 40 minutes  SIGNED:   Burnadette Pop, MD  [facility] 01/27/2021, 12:35 PM Pager [phone]  If 7PM-7AM, please contact night-coverage [email or web address] Password [email or web address]

## 2021-01-27 NOTE — Progress Notes (Signed)
Reviewed d/c instructions w pt and all questions answered. Pt verbalized understanding. D/c per w/c w all belongings in stable condition.

## 2021-01-30 ENCOUNTER — Emergency Department (HOSPITAL_COMMUNITY): Payer: BC Managed Care – PPO

## 2021-01-30 ENCOUNTER — Encounter (HOSPITAL_COMMUNITY): Payer: Self-pay

## 2021-01-30 ENCOUNTER — Inpatient Hospital Stay (HOSPITAL_COMMUNITY)
Admission: EM | Admit: 2021-01-30 | Discharge: 2021-02-03 | DRG: 682 | Disposition: A | Discharge status: Home / Self Care | Payer: BC Managed Care – PPO | Attending: Internal Medicine | Admitting: Internal Medicine

## 2021-01-30 ENCOUNTER — Other Ambulatory Visit: Payer: Self-pay

## 2021-01-30 DIAGNOSIS — K859 Acute pancreatitis without necrosis or infection, unspecified: Secondary | ICD-10-CM | POA: Diagnosis present

## 2021-01-30 DIAGNOSIS — T39395A Adverse effect of other nonsteroidal anti-inflammatory drugs [NSAID], initial encounter: Secondary | ICD-10-CM | POA: Diagnosis present

## 2021-01-30 DIAGNOSIS — K5900 Constipation, unspecified: Secondary | ICD-10-CM | POA: Diagnosis present

## 2021-01-30 DIAGNOSIS — N179 Acute kidney failure, unspecified: Secondary | ICD-10-CM

## 2021-01-30 DIAGNOSIS — D75838 Other thrombocytosis: Secondary | ICD-10-CM | POA: Diagnosis present

## 2021-01-30 DIAGNOSIS — R9431 Abnormal electrocardiogram [ECG] [EKG]: Secondary | ICD-10-CM | POA: Diagnosis not present

## 2021-01-30 DIAGNOSIS — F1010 Alcohol abuse, uncomplicated: Secondary | ICD-10-CM | POA: Diagnosis present

## 2021-01-30 DIAGNOSIS — E871 Hypo-osmolality and hyponatremia: Secondary | ICD-10-CM | POA: Diagnosis present

## 2021-01-30 DIAGNOSIS — E860 Dehydration: Secondary | ICD-10-CM | POA: Diagnosis present

## 2021-01-30 DIAGNOSIS — K858 Other acute pancreatitis without necrosis or infection: Secondary | ICD-10-CM | POA: Diagnosis present

## 2021-01-30 DIAGNOSIS — Z20822 Contact with and (suspected) exposure to covid-19: Secondary | ICD-10-CM | POA: Diagnosis present

## 2021-01-30 LAB — URINALYSIS, ROUTINE W REFLEX MICROSCOPIC
Bilirubin Urine: NEGATIVE
Glucose, UA: NEGATIVE mg/dL
Ketones, ur: 20 mg/dL — AB
Leukocytes,Ua: NEGATIVE
Nitrite: NEGATIVE
Protein, ur: 30 mg/dL — AB
Specific Gravity, Urine: 1.015 (ref 1.005–1.030)
pH: 5 (ref 5.0–8.0)

## 2021-01-30 LAB — COMPREHENSIVE METABOLIC PANEL
ALT: 38 U/L (ref 0–44)
AST: 38 U/L (ref 15–41)
Albumin: 5.1 g/dL — ABNORMAL HIGH (ref 3.5–5.0)
Alkaline Phosphatase: 87 U/L (ref 38–126)
Anion gap: 24 — ABNORMAL HIGH (ref 5–15)
BUN: 54 mg/dL — ABNORMAL HIGH (ref 6–20)
CO2: 24 mmol/L (ref 22–32)
Calcium: 10.7 mg/dL — ABNORMAL HIGH (ref 8.9–10.3)
Chloride: 82 mmol/L — ABNORMAL LOW (ref 98–111)
Creatinine, Ser: 5.57 mg/dL — ABNORMAL HIGH (ref 0.61–1.24)
GFR, Estimated: 14 mL/min — ABNORMAL LOW (ref >60)
Glucose, Bld: 158 mg/dL — ABNORMAL HIGH (ref 70–99)
Potassium: 3.5 mmol/L (ref 3.5–5.1)
Sodium: 130 mmol/L — ABNORMAL LOW (ref 135–145)
Total Bilirubin: 0.8 mg/dL (ref 0.3–1.2)
Total Protein: 10.9 g/dL — ABNORMAL HIGH (ref 6.5–8.1)

## 2021-01-30 LAB — CBC
HCT: 42.6 % (ref 39.0–52.0)
Hemoglobin: 15 g/dL (ref 13.0–17.0)
MCH: 30.9 pg (ref 26.0–34.0)
MCHC: 35.2 g/dL (ref 30.0–36.0)
MCV: 87.8 fL (ref 80.0–100.0)
Platelets: 686 10*3/uL — ABNORMAL HIGH (ref 150–400)
RBC: 4.85 MIL/uL (ref 4.22–5.81)
RDW: 12.7 % (ref 11.5–15.5)
WBC: 9.4 10*3/uL (ref 4.0–10.5)
nRBC: 0 % (ref 0.0–0.2)

## 2021-01-30 LAB — LIPASE, BLOOD: Lipase: 580 U/L — ABNORMAL HIGH (ref 11–51)

## 2021-01-30 LAB — TROPONIN I (HIGH SENSITIVITY)
Troponin I (High Sensitivity): 6 ng/L (ref <18)
Troponin I (High Sensitivity): 5 ng/L (ref <18)

## 2021-01-30 MED ORDER — SODIUM CHLORIDE 0.9 % IV BOLUS
1000.0000 mL | Freq: Once | INTRAVENOUS | Status: AC
  Administered 2021-01-30: 1000 mL via INTRAVENOUS

## 2021-01-30 MED ORDER — ONDANSETRON HCL 4 MG/2ML IJ SOLN
4.0000 mg | Freq: Once | INTRAMUSCULAR | Status: AC
  Administered 2021-01-30: 4 mg via INTRAVENOUS
  Filled 2021-01-30: qty 2

## 2021-01-30 MED ORDER — HYDROMORPHONE HCL 1 MG/ML IJ SOLN
1.0000 mg | Freq: Once | INTRAMUSCULAR | Status: AC
  Administered 2021-01-30: 1 mg via INTRAVENOUS
  Filled 2021-01-30: qty 1

## 2021-01-30 MED ORDER — SODIUM CHLORIDE 0.9 % IV SOLN: INTRAVENOUS | Status: DC

## 2021-01-30 MED ORDER — SODIUM CHLORIDE 0.9 % IV BOLUS (SEPSIS)
1000.0000 mL | Freq: Once | INTRAVENOUS | Status: DC
  Administered 2021-01-30: 1000 mL via INTRAVENOUS

## 2021-01-30 NOTE — ED Provider Notes (Signed)
[facility]  HOSPITAL-EMERGENCY DEPT Provider Note   CSN: [id number] Arrival date & time: 01/30/21  1740     History Chief Complaint  Patient presents with   Abdominal Pain    Garrett Kane is a 25 y.o. male.  Patient has a history of pancreatitis and just left the hospital.  Patient complains of not keep anything down for couple days and having epigastric pain  The history is provided by the patient. No language interpreter was used.  Abdominal Pain Pain location:  Generalized Pain quality: aching   Pain radiates to:  Does not radiate Pain severity:  Moderate Onset quality:  Sudden Timing:  Constant Progression:  Waxing and waning Chronicity:  Recurrent Context: not medication withdrawal   Relieved by:  Nothing Associated symptoms: vomiting   Associated symptoms: no chest pain, no cough, no diarrhea, no fatigue and no hematuria       Past Medical History:  Diagnosis Date   Medical history non-contributory     Patient Active Problem List   Diagnosis Date Noted   Acute pancreatitis 01/24/2021   Acute pancreatitis without infection or necrosis, unspecified pancreatitis type 01/23/2021    Past Surgical History:  Procedure Laterality Date   NO PAST SURGERIES         History reviewed. No pertinent family history.  Social History   Tobacco Use   Smoking status: Never   Smokeless tobacco: Never  Vaping Use   Vaping Use: Never used  Substance Use Topics   Alcohol use: Yes    Comment: drinks almost daily, about 1 pint liquor daily   Drug use: Yes    Types: Marijuana    Comment: daily use    Home Medications Prior to Admission medications   Medication Sig Start Date End Date Taking? Authorizing Provider  folic acid (FOLVITE) 1 MG tablet Take 1 tablet (1 mg total) by mouth daily. 01/28/21  Yes Burnadette Pop, MD  ibuprofen (ADVIL) 200 MG tablet Take 400 mg by mouth every 6 (six) hours as needed.   Yes [provider]  omeprazole  (PRILOSEC) 20 MG capsule Take 20 mg by mouth daily.   Yes [provider]  traMADol (ULTRAM) 50 MG tablet Take 1 tablet (50 mg total) by mouth every 6 (six) hours as needed. 01/27/21 01/27/22 Yes Burnadette Pop, MD  thiamine 100 MG tablet Take 1 tablet (100 mg total) by mouth daily. Patient not taking: Reported on 01/30/2021 01/28/21   Burnadette Pop, MD    Allergies    Patient has no known allergies.  Review of Systems   Review of Systems  Constitutional:  Negative for appetite change and fatigue.  HENT:  Negative for congestion, ear discharge and sinus pressure.   Eyes:  Negative for discharge.  Respiratory:  Negative for cough.   Cardiovascular:  Negative for chest pain.  Gastrointestinal:  Positive for abdominal pain and vomiting. Negative for diarrhea.  Genitourinary:  Negative for frequency and hematuria.  Musculoskeletal:  Negative for back pain.  Skin:  Negative for rash.  Neurological:  Negative for seizures and headaches.  Psychiatric/Behavioral:  Negative for hallucinations.    Physical Exam Updated Vital Signs BP (!) 153/121   Pulse 86   Temp 98.6 F (37 C)   Resp 16   SpO2 100%   Physical Exam Vitals and nursing note reviewed.  Constitutional:      Appearance: He is well-developed.  HENT:     Head: Normocephalic.  Eyes:     General: No  scleral icterus.    Conjunctiva/sclera: Conjunctivae normal.  Neck:     Thyroid: No thyromegaly.  Cardiovascular:     Rate and Rhythm: Normal rate and regular rhythm.     Heart sounds: No murmur heard.   No friction rub. No gallop.  Pulmonary:     Breath sounds: No stridor. No wheezing or rales.  Chest:     Chest wall: No tenderness.  Abdominal:     General: There is no distension.     Tenderness: There is abdominal tenderness. There is no rebound.  Musculoskeletal:        General: Normal range of motion.     Cervical back: Neck supple.  Lymphadenopathy:     Cervical: No cervical adenopathy.  Skin:     Findings: No erythema or rash.  Neurological:     Mental Status: He is alert and oriented to person, place, and time.     Motor: No abnormal muscle tone.     Coordination: Coordination normal.  Psychiatric:        Behavior: Behavior normal.    ED Results / Procedures / Treatments   Labs (all labs ordered are listed, but only abnormal results are displayed) Labs Reviewed  LIPASE, BLOOD - Abnormal; Notable for the following components:      Result Value   Lipase 580 (*)    All other components within normal limits  COMPREHENSIVE METABOLIC PANEL - Abnormal; Notable for the following components:   Sodium 130 (*)    Chloride 82 (*)    Glucose, Bld 158 (*)    BUN 54 (*)    Creatinine, Ser 5.57 (*)    Calcium 10.7 (*)    Total Protein 10.9 (*)    Albumin 5.1 (*)    GFR, Estimated 14 (*)    Anion gap 24 (*)    All other components within normal limits  CBC - Abnormal; Notable for the following components:   Platelets 686 (*)    All other components within normal limits  URINALYSIS, ROUTINE W REFLEX MICROSCOPIC  TROPONIN I (HIGH SENSITIVITY)  TROPONIN I (HIGH SENSITIVITY)    EKG None  Radiology DG ABD ACUTE 2+V W 1V CHEST  Result Date: 01/30/2021 CLINICAL DATA:  Pain EXAM: DG ABDOMEN ACUTE WITH 1 VIEW CHEST COMPARISON:  CT 01/23/2021 FINDINGS: There is no evidence of dilated bowel loops or free intraperitoneal air. No radiopaque calculi or other significant radiographic abnormality is seen. Heart size and mediastinal contours are within normal limits. Both lungs are clear. IMPRESSION: Negative abdominal radiographs.  No acute cardiopulmonary disease. Electronically Signed   By: Jasmine Pang M.D.   On: 01/30/2021 19:57    Procedures Procedures   Medications Ordered in ED Medications  sodium chloride 0.9 % bolus 1,000 mL (1,000 mLs Intravenous New Bag/Given 01/30/21 2229)  sodium chloride 0.9 % bolus 1,000 mL (0 mLs Intravenous Stopped 01/30/21 2100)  ondansetron (ZOFRAN)  injection 4 mg (4 mg Intravenous Given 01/30/21 1950)  HYDROmorphone (DILAUDID) injection 1 mg (1 mg Intravenous Given 01/30/21 1950)    ED Course  I have reviewed the triage vital signs and the nursing notes.  Pertinent labs & imaging results that were available during my care of the patient were reviewed by me and considered in my medical decision making (see chart for details).    MDM Rules/Calculators/A&P                          Patient  with abdominal pain from pancreatitis and severe AKI.  He will be admitted to medicine and hydrated Final Clinical Impression(s) / ED Diagnoses Final diagnoses:  None    Rx / DC Orders ED Discharge Orders     None        Bethann Berkshire, MD 01/30/21 2248

## 2021-01-30 NOTE — ED Provider Notes (Addendum)
I spoke to Dr. Allena Katz with urology.  He reviewed the patient's chart and recommended aggressive hydration at 1 L bolus of normal saline and then 100 cc an hour throughout the night.  He stated if the patient's creatinine was not improving then the hospitalist should formally consult nephrology tomorrow.  He also stated the patient could be admitted to [facility]   Bethann Berkshire, MD 01/30/21 2249    Bethann Berkshire, MD 01/30/21 2251

## 2021-01-30 NOTE — ED Triage Notes (Signed)
Pt reports abdominal pain that radiates to back and N/V. Pt states he was just admitted to the hospital for acute pancreatitis.

## 2021-01-30 NOTE — ED Notes (Signed)
Pt going to xray  

## 2021-01-30 NOTE — H&P (Signed)
History and Physical    Garrett Kane JHE:[id number] DOB: 1995/11/16 DOA: 01/30/2021  PCP: Pcp, No   Patient coming from: Home  Chief Complaint: Abdominal pain, nausea and vomiting.  HPI: Garrett Kane is a 25 y.o. male with medical history significant for recent pancreatitis, daily marijuana use who presents with abdominal pain with nausea and vomiting.  He was in the hospital last week for acute pancreatitis secondary to alcohol abuse.  He was discharged on Saturday afternoon.  He reports that he has not been drinking any alcohol since his discharge.  He states on Sunday his abdominal pain worsened and became more severe and sharp like it did when he was admitted.  States that the worst his pain was an 8-9 out of 10.  He had nausea and vomiting with the abdominal pain was not able to keep any food or fluids down.  He was taking tramadol and ibuprofen at home for the pain which was not helping he states.  He states he had been drinking approximately a pint of liquor every day when he was admitted last time but he has not had anything to drink in over a week week.  He does state he smokes marijuana daily.  Reports he has vomited multiple times in the last 24 to 48 hours.  He denies any injury or trauma to his abdomen.  He has not had any fever or chills.  He denies diarrhea.  He denies any urinary symptoms.  ED Course: The emergency room he has been hemodynamically stable.  He has significant abdominal pain to palpation of his epigastric and left upper quadrant abdomen.  He was given a liter of fluid IV in the emergency room.  His labs show sodium of 130, potassium 3.5, chloride 82, bicarb 24, creatinine 5.57 which is increased from 0.82 and a BUN of 54 which is increased from 6.  Glucose is 158.  Lipase is 580.  Alkaline phosphatase 87, AST 38, ALT 38.  CBC shows thrombocytosis with a platelet count of 686,000 elevated from 260,000 300,000 when he was admitted last week.  Hospitalist service was asked  to admit for further management.  ER physician discussed with nephrology who recommended IV fluids overnight if his creatinine does not improve by morning to officially consult nephrology for evaluation tomorrow  Review of Systems:  General: Reports decreased appetite. Denies fever, chills, weight loss, night sweats.  Denies dizziness.  HENT: Denies head trauma, headache, denies change in hearing, tinnitus.  Denies nasal congestion.  Denies sore throat,  Denies difficulty swallowing Eyes: Denies blurry vision, pain in eye, drainage.  Denies discoloration of eyes. Neck: Denies pain.  Denies swelling.  Denies pain with movement. Cardiovascular: Denies chest pain, palpitations.  Denies edema.  Denies orthopnea Respiratory: Denies shortness of breath, cough.  Denies wheezing.  Denies sputum production Gastrointestinal: Reports abdominal pain, nausea, vomiting. Denies diarrhea.  Denies melena.  Denies hematemesis. Musculoskeletal: Denies limitation of movement.  Denies deformity or swelling. Denies arthralgias or myalgias. Genitourinary: Denies pelvic pain.  Denies urinary frequency or hesitancy.  Denies dysuria.  Skin: Denies rash.  Denies petechiae, purpura, ecchymosis. Neurological: Denies syncope.  Denies seizure activity.  Denies paresthesia. Denies visual change. Psychiatric: Denies depression, anxiety.  Denies hallucinations.  Past Medical History:  Diagnosis Date   Medical history non-contributory     Past Surgical History:  Procedure Laterality Date   NO PAST SURGERIES      Social History  reports that he has never smoked. He has never  used smokeless tobacco. He reports current alcohol use. He reports current drug use. Drug: Marijuana.  No Known Allergies  History reviewed. No pertinent family history.   Prior to Admission medications   Medication Sig Start Date End Date Taking? Authorizing Provider  folic acid (FOLVITE) 1 MG tablet Take 1 tablet (1 mg total) by mouth daily.  01/28/21  Yes Burnadette Pop, MD  ibuprofen (ADVIL) 200 MG tablet Take 400 mg by mouth every 6 (six) hours as needed.   Yes [provider]  omeprazole (PRILOSEC) 20 MG capsule Take 20 mg by mouth daily.   Yes [provider]  traMADol (ULTRAM) 50 MG tablet Take 1 tablet (50 mg total) by mouth every 6 (six) hours as needed. 01/27/21 01/27/22 Yes Burnadette Pop, MD  thiamine 100 MG tablet Take 1 tablet (100 mg total) by mouth daily. Patient not taking: Reported on 01/30/2021 01/28/21   Burnadette Pop, MD    Physical Exam: Vitals:   01/30/21 2000 01/30/21 2030 01/30/21 2202 01/30/21 2245  BP: (!) 142/115 (!) 150/117 (!) 153/121 (!) 155/111  Pulse: (!) 104 97 86 100  Resp: (!) 23 16 16 16   Temp:      SpO2: 98% 99% 100% 100%    Constitutional: NAD, calm, comfortable Vitals:   01/30/21 2000 01/30/21 2030 01/30/21 2202 01/30/21 2245  BP: (!) 142/115 (!) 150/117 (!) 153/121 (!) 155/111  Pulse: (!) 104 97 86 100  Resp: (!) 23 16 16 16   Temp:      SpO2: 98% 99% 100% 100%   General: WDWN, Alert and oriented x3.  Eyes: EOMI, PERRL, conjunctivae normal.  Sclera nonicteric HENT:  [place]/AT, external ears normal.  Nares patent without epistasis.  Mucous membranes are moist. Posterior pharynx clear of any exudate or lesions. Normal dentition.  Neck: Soft, normal range of motion, supple, no masses, no thyromegaly.  Trachea midline Respiratory: clear to auscultation bilaterally, no wheezing, no crackles. Normal respiratory effort. No accessory muscle use.  Cardiovascular: Regular rate and rhythm, no murmurs / rubs / gallops. No extremity edema.  Abdomen: Soft, Has epigastric and LUQ tenderness to palpation, nondistended, no rebound or guarding.  No masses palpated. No hepatosplenomegaly. Bowel sounds hyperactive Musculoskeletal: FROM. no cyanosis. No joint deformity upper and lower extremities. Normal muscle tone.  Skin: Warm, dry, intact no rashes, lesions, ulcers. No  induration Neurologic: CN 2-12 grossly intact.  Normal speech.  Sensation intact, Strength 5/5 in all extremities.   Psychiatric: Normal judgment and insight.  Normal mood.    Labs on Admission: I have personally reviewed following labs and imaging studies  CBC: Recent Labs  Lab 01/24/21 0425 01/30/21 1852  WBC 10.2 9.4  HGB 15.6 15.0  HCT 46.4 42.6  MCV 92.6 87.8  PLT 262 686*    Basic Metabolic Panel: Recent Labs  Lab 01/24/21 0425 01/25/21 0417 01/26/21 0344 01/30/21 1852  NA 135 134* 135 130*  K 4.0 3.7 3.5 3.5  CL 102 96* 96* 82*  CO2 22 28 26 24   GLUCOSE 136* 110* 85 158*  BUN 8 5* 6 54*  CREATININE 1.01 0.94 0.82 5.57*  CALCIUM 9.2 9.0 8.8* 10.7*    GFR: Estimated Creatinine Clearance: 18.8 mL/min (A) (by C-G formula based on SCr of 5.57 mg/dL (H)).  Liver Function Tests: Recent Labs  Lab 01/24/21 0425 01/30/21 1852  AST 22 38  ALT 19 38  ALKPHOS 46 87  BILITOT 1.6* 0.8  PROT 7.3 10.9*  ALBUMIN 4.0 5.1*  Urine analysis:    Component Value Date/Time   COLORURINE YELLOW 01/30/2021 2238   APPEARANCEUR HAZY (A) 01/30/2021 2238   LABSPEC 1.015 01/30/2021 2238   PHURINE 5.0 01/30/2021 2238   GLUCOSEU NEGATIVE 01/30/2021 2238   HGBUR SMALL (A) 01/30/2021 2238   BILIRUBINUR NEGATIVE 01/30/2021 2238   KETONESUR 20 (A) 01/30/2021 2238   PROTEINUR 30 (A) 01/30/2021 2238   NITRITE NEGATIVE 01/30/2021 2238   LEUKOCYTESUR NEGATIVE 01/30/2021 2238    Radiological Exams on Admission: DG ABD ACUTE 2+V W 1V CHEST  Result Date: 01/30/2021 CLINICAL DATA:  Pain EXAM: DG ABDOMEN ACUTE WITH 1 VIEW CHEST COMPARISON:  CT 01/23/2021 FINDINGS: There is no evidence of dilated bowel loops or free intraperitoneal air. No radiopaque calculi or other significant radiographic abnormality is seen. Heart size and mediastinal contours are within normal limits. Both lungs are clear. IMPRESSION: Negative abdominal radiographs.  No acute cardiopulmonary disease. Electronically  Signed   By: Jasmine PangKim  Fujinaga M.D.   On: 01/30/2021 19:57    EKG: Independently reviewed.  EKG shows sinus tachycardia with right atrial enlargement.  No acute ST elevation or depression.  QTc prolonged at 528  Assessment/Plan Principal Problem:   ARF (acute renal failure)  Mr. Durwin GlazeGlover is admitted to MedSurg floor.  He was given a bolus of IV fluid in the emergency room. Continue IV fluid hydration with LR at 100 ml/hr  Repeat electrolytes and renal function in morning If creatinine does not improve with IV fluid hydration we will consult nephrology in the morning Hold NSAIDs and other nephrotoxic medications  Active Problems:   Acute pancreatitis Patient was admitted last week with acute pancreatitis secondary to alcohol use.  He has not had any alcohol use since his discharge last Saturday but continues to have severe abdominal pain associate with nausea and vomiting unable to tolerate p.o. fluids Patient will be given Dilaudid IV every 3 hours as needed for severe pain and oxycodone every 6 hours as needed for moderate pain Tigan use for nausea and vomiting secondary to patient having prolonged QT interval and cannot get Zofran    Prolonged QT interval Avoid medications which could further prolong QT interval    DVT prophylaxis: Padua score low. TED hose and early ambulation for DVT prophylaxis.  Code Status:   Full Code  Family Communication:  Diagnosis and plan discussed with patient.  He verbalized understanding agrees with plan.  Further recommendations to follow as clinically indicated Disposition Plan:   Patient is from:  Home  Anticipated DC to:  Home  Anticipated DC date:  Anticipate 2 midnight or more stay in the hospital  Anticipated DC barriers: No barriers to discharge identified at this time  Consults called:        ER physician discussed with nephrology who asked to be consulted in am if creatinine does not improve Admission status:  Inpatient   Claudean SeveranceBradley S Agatha Duplechain  MD [facility]  How to contact the [facility] Attending or Consulting provider 7A - 7P or covering provider during after hours 7P -7A, for this patient?   Check the care team in [facility] and look for a) attending/consulting [facility] provider listed and b) the [facility] team listed Log into [email or web address] and use [place]'s universal password to access. If you do not have the password, please contact the hospital operator. Locate the [facility] provider you are looking for under [facility] and page to a number that you can be directly reached. If you still have difficulty reaching the  provider, please page the [facility] (Director on Call) for the Hospitalists listed on amion for assistance.  01/30/2021, 11:19 PM

## 2021-01-30 NOTE — ED Provider Notes (Signed)
Emergency Medicine Provider Triage Evaluation Note  Sheryl Towell , a 25 y.o. male  was evaluated in triage.  Pt complains of abdominal pain, nausea and vomiting.  Pain radiates to the back.  History of pancreatitis and this feels similar although did not have back pain with his last work-up pancreatitis.  Denies any fevers or chest pain.  Review of Systems  Positive: Abdominal pain, back pain, vomiting Negative: Chest pain, fever  Physical Exam  BP (!) 142/100 (BP Location: Right Arm)   Pulse (!) 128   Temp 98.6 F (37 C)   Resp 18   SpO2 98%  Gen:   Awake, no distress   Resp:  Normal effort  MSK:   Moves extremities without difficulty  Other:  Patient tachycardic, abdomen generally tender  Medical Decision Making  Medically screening exam initiated at 6:39 PM.  Appropriate orders placed.  Dariusz Brase was informed that the remainder of the evaluation will be completed by another provider, this initial triage assessment does not replace that evaluation, and the importance of remaining in the ED until their evaluation is complete.  Lab work and EKG ordered and patient will be roomed   Dietrich Pates, PA-C 01/30/21 1841    Vanetta Mulders, MD 02/02/21 0730

## 2021-01-31 DIAGNOSIS — K859 Acute pancreatitis without necrosis or infection, unspecified: Secondary | ICD-10-CM

## 2021-01-31 DIAGNOSIS — E871 Hypo-osmolality and hyponatremia: Secondary | ICD-10-CM

## 2021-01-31 DIAGNOSIS — R9431 Abnormal electrocardiogram [ECG] [EKG]: Secondary | ICD-10-CM

## 2021-01-31 LAB — CBC
HCT: 35.2 % — ABNORMAL LOW (ref 39.0–52.0)
Hemoglobin: 12 g/dL — ABNORMAL LOW (ref 13.0–17.0)
MCH: 30.2 pg (ref 26.0–34.0)
MCHC: 34.1 g/dL (ref 30.0–36.0)
MCV: 88.7 fL (ref 80.0–100.0)
Platelets: 591 10*3/uL — ABNORMAL HIGH (ref 150–400)
RBC: 3.97 MIL/uL — ABNORMAL LOW (ref 4.22–5.81)
RDW: 12.9 % (ref 11.5–15.5)
WBC: 7.6 10*3/uL (ref 4.0–10.5)
nRBC: 0 % (ref 0.0–0.2)

## 2021-01-31 LAB — BASIC METABOLIC PANEL
Anion gap: 13 (ref 5–15)
BUN: 33 mg/dL — ABNORMAL HIGH (ref 6–20)
CO2: 26 mmol/L (ref 22–32)
Calcium: 9.3 mg/dL (ref 8.9–10.3)
Chloride: 93 mmol/L — ABNORMAL LOW (ref 98–111)
Creatinine, Ser: 2.21 mg/dL — ABNORMAL HIGH (ref 0.61–1.24)
GFR, Estimated: 42 mL/min — ABNORMAL LOW (ref >60)
Glucose, Bld: 123 mg/dL — ABNORMAL HIGH (ref 70–99)
Potassium: 3.7 mmol/L (ref 3.5–5.1)
Sodium: 132 mmol/L — ABNORMAL LOW (ref 135–145)

## 2021-01-31 LAB — RESP PANEL BY RT-PCR (FLU A&B, COVID) ARPGX2
Influenza A by PCR: NEGATIVE
Influenza B by PCR: NEGATIVE
SARS Coronavirus 2 by RT PCR: NEGATIVE

## 2021-01-31 MED ORDER — ACETAMINOPHEN 325 MG PO TABS: 650.0000 mg | ORAL_TABLET | Freq: Four times a day (QID) | ORAL | Status: DC | PRN

## 2021-01-31 MED ORDER — LORAZEPAM 1 MG PO TABS: 1.0000 mg | ORAL_TABLET | ORAL | Status: DC | PRN

## 2021-01-31 MED ORDER — ACETAMINOPHEN 650 MG RE SUPP: 650.0000 mg | Freq: Four times a day (QID) | RECTAL | Status: DC | PRN

## 2021-01-31 MED ORDER — THIAMINE HCL 100 MG/ML IJ SOLN: 100.0000 mg | Freq: Every day | INTRAMUSCULAR | Status: DC

## 2021-01-31 MED ORDER — SENNOSIDES-DOCUSATE SODIUM 8.6-50 MG PO TABS
1.0000 | ORAL_TABLET | Freq: Two times a day (BID) | ORAL | Status: DC
  Administered 2021-01-31 – 2021-02-03 (×6): 1 via ORAL
  Filled 2021-01-31 (×6): qty 1

## 2021-01-31 MED ORDER — LORAZEPAM 2 MG/ML IJ SOLN: 1.0000 mg | INTRAMUSCULAR | Status: DC | PRN

## 2021-01-31 MED ORDER — HYDROMORPHONE HCL 1 MG/ML IJ SOLN
1.0000 mg | INTRAMUSCULAR | Status: DC | PRN
  Administered 2021-01-31 – 2021-02-01 (×4): 1 mg via INTRAVENOUS
  Filled 2021-01-31 (×4): qty 1

## 2021-01-31 MED ORDER — LACTATED RINGERS IV SOLN: INTRAVENOUS | Status: DC

## 2021-01-31 MED ORDER — HYDROMORPHONE HCL 1 MG/ML IJ SOLN
1.0000 mg | INTRAMUSCULAR | Status: AC | PRN
  Administered 2021-01-31 (×4): 1 mg via INTRAVENOUS
  Filled 2021-01-31 (×4): qty 1

## 2021-01-31 MED ORDER — OXYCODONE HCL 5 MG PO TABS
5.0000 mg | ORAL_TABLET | Freq: Four times a day (QID) | ORAL | Status: AC | PRN
  Administered 2021-01-31 – 2021-02-01 (×3): 5 mg via ORAL
  Filled 2021-01-31 (×3): qty 1

## 2021-01-31 MED ORDER — ENOXAPARIN SODIUM 30 MG/0.3ML IJ SOSY
30.0000 mg | PREFILLED_SYRINGE | INTRAMUSCULAR | Status: DC
  Administered 2021-01-31 – 2021-02-01 (×2): 30 mg via SUBCUTANEOUS
  Filled 2021-01-31 (×2): qty 0.3

## 2021-01-31 MED ORDER — THIAMINE HCL 100 MG PO TABS
100.0000 mg | ORAL_TABLET | Freq: Every day | ORAL | Status: DC
  Administered 2021-01-31 – 2021-02-03 (×4): 100 mg via ORAL
  Filled 2021-01-31 (×4): qty 1

## 2021-01-31 MED ORDER — SODIUM CHLORIDE 0.9 % IV BOLUS
2000.0000 mL | Freq: Once | INTRAVENOUS | Status: AC
  Administered 2021-01-31: 2000 mL via INTRAVENOUS

## 2021-01-31 MED ORDER — TRIMETHOBENZAMIDE HCL 100 MG/ML IM SOLN
200.0000 mg | Freq: Four times a day (QID) | INTRAMUSCULAR | Status: DC | PRN
Start: 2021-01-31 — End: 2021-02-03
  Filled 2021-01-31 (×2): qty 2

## 2021-01-31 MED ORDER — FOLIC ACID 1 MG PO TABS
1.0000 mg | ORAL_TABLET | Freq: Every day | ORAL | Status: DC
  Administered 2021-01-31 – 2021-02-03 (×4): 1 mg via ORAL
  Filled 2021-01-31 (×4): qty 1

## 2021-01-31 MED ORDER — ADULT MULTIVITAMIN W/MINERALS CH
1.0000 | ORAL_TABLET | Freq: Every day | ORAL | Status: DC
  Administered 2021-01-31 – 2021-02-03 (×4): 1 via ORAL
  Filled 2021-01-31 (×4): qty 1

## 2021-01-31 NOTE — Progress Notes (Addendum)
PROGRESS NOTE    Garrett Kane  GUR:[id number] DOB: 05-28-1996 DOA: 01/30/2021 PCP: Pcp, No    Chief Complaint  Patient presents with   Abdominal Pain    Brief Narrative:  Patient 25 year old gentleman, prior history of alcohol abuse recently hospitalized for acute pancreatitis 01/23/2021-01/27/2021, presenting back to the ED with significant upper abdominal and back pain.  Patient denied any ongoing alcohol use since last hospitalization.  Patient noted to have elevated lipase level, noted in acute renal failure, and admitted for acute renal failure and acute pancreatitis.  Patient placed on IV fluids and IV pain medication.   Assessment & Plan:   Principal Problem:   Acute pancreatitis Active Problems:   ARF (acute renal failure) (HCC)   Prolonged QT interval   Hyponatremia   #1 acute pancreatitis -Patient recently hospitalized 01/23/2021-01/27/2021 for acute pancreatitis secondary to alcohol use. -Patient denies any ongoing alcohol use since recent hospitalization. -Patient presenting with acute abdominal and back pain noted to have elevated lipase level of 580. -CT abdomen and pelvis from 01/23/2021 with normal gallbladder, no visible calcified gallstones, no biliary ductal dilatation. -Check a fasting lipid panel -2 L normal saline bolus. -Increase IV fluids to normal saline 150 cc an hour. -Continue clear liquids. -IV pain management, supportive care.  2.  Acute renal failure -Patient presented in acute renal failure with a creatinine of 5.57 on presentation. -Last creatinine of 0.82 (01/26/2021). -Likely secondary to a prerenal azotemia in the setting of NSAID use. -Hold NSAIDs. -Renal function improving creatinine currently at 2.21. -Continue aggressive fluid resuscitation. -Monitor urine output. -Follow.  3.  Hyponatremia -Improving with hydration.  Follow.  4.  Prolonged QT interval -Repeat EKG. -Replete electrolytes. -Avoid QT prolongation medications.  5.   History of alcohol abuse -Alcohol cessation stressed to patient. -Patient states has not had any alcohol since last hospitalization. -Alcohol level on admission was negative. -Place on Ativan withdrawal protocol. -Thiamine 100 mg daily, folic acid 1 mg daily, multivitamin.   DVT prophylaxis: Lovenox Code Status: Full Family Communication: Updated patient.  No family at bedside. Disposition:   Status is: Inpatient  Remains inpatient appropriate because:Inpatient level of care appropriate due to severity of illness  Dispo: The patient is from: Home              Anticipated d/c is to: Home              Patient currently is not medically stable to d/c.   Difficult to place patient No       Consultants:  None  Procedures:  Acute abdominal series 01/30/2021  Antimicrobials:  None   Subjective: Sitting up in bed on the phone.  Tolerating clears.  Denies any chest pain.  No shortness of breath.  States back pain and abdominal pain slowly improving and better than on presentation.  Still requiring IV pain medication.  Objective: Vitals:   01/31/21 1400 01/31/21 1530 01/31/21 1730 01/31/21 1811  BP: (!) 139/109 (!) 141/100 (!) 141/103 (!) 144/107  Pulse: 96 79 92 83  Resp: 18 18  (!) 22  Temp:   98.6 F (37 C) 98.9 F (37.2 C)  SpO2: 97% 100% 98% 100%    Intake/Output Summary (Last 24 hours) at 01/31/2021 1828 Last data filed at 01/31/2021 1536 Gross per 24 hour  Intake 5215.48 ml  Output --  Net 5215.48 ml   There were no vitals filed for this visit.  Examination:  General exam: Appears calm and comfortable  Respiratory system:  Clear to auscultation. Respiratory effort normal. Cardiovascular system: S1 & S2 heard, RRR. No JVD, murmurs, rubs, gallops or clicks. No pedal edema. Gastrointestinal system: Abdomen is nondistended, soft and some tenderness to palpation bilateral flanks.  Positive bowel sounds.  No rebound.  No guarding. Central nervous system: Alert and  oriented. No focal neurological deficits. Extremities: Symmetric 5 x 5 power. Skin: No rashes, lesions or ulcers Psychiatry: Judgement and insight appear normal. Mood & affect appropriate.     Data Reviewed: I have personally reviewed following labs and imaging studies  CBC: Recent Labs  Lab 01/30/21 1852 01/31/21 0520  WBC 9.4 7.6  HGB 15.0 12.0*  HCT 42.6 35.2*  MCV 87.8 88.7  PLT 686* 591*    Basic Metabolic Panel: Recent Labs  Lab 01/25/21 0417 01/26/21 0344 01/30/21 1852 01/31/21 0520  NA 134* 135 130* 132*  K 3.7 3.5 3.5 3.7  CL 96* 96* 82* 93*  CO2 28 26 24 26   GLUCOSE 110* 85 158* 123*  BUN 5* 6 54* 33*  CREATININE 0.94 0.82 5.57* 2.21*  CALCIUM 9.0 8.8* 10.7* 9.3    GFR: Estimated Creatinine Clearance: 47.4 mL/min (A) (by C-G formula based on SCr of 2.21 mg/dL (H)).  Liver Function Tests: Recent Labs  Lab 01/30/21 1852  AST 38  ALT 38  ALKPHOS 87  BILITOT 0.8  PROT 10.9*  ALBUMIN 5.1*    CBG: No results for input(s): GLUCAP in the last 168 hours.   Recent Results (from the past 240 hour(s))  Resp Panel by RT-PCR (Flu A&B, Covid) Nasopharyngeal Swab     Status: None   Collection Time: 01/23/21  8:05 PM   Specimen: Nasopharyngeal Swab; Nasopharyngeal(NP) swabs in vial transport medium  Result Value Ref Range Status   SARS Coronavirus 2 by RT PCR NEGATIVE NEGATIVE Final    Comment: (NOTE) SARS-CoV-2 target nucleic acids are NOT DETECTED.  The SARS-CoV-2 RNA is generally detectable in upper respiratory specimens during the acute phase of infection. The lowest concentration of SARS-CoV-2 viral copies this assay can detect is 138 copies/mL. A negative result does not preclude SARS-Cov-2 infection and should not be used as the sole basis for treatment or other patient management decisions. A negative result may occur with  improper specimen collection/handling, submission of specimen other than nasopharyngeal swab, presence of viral  mutation(s) within the areas targeted by this assay, and inadequate number of viral copies(<138 copies/mL). A negative result must be combined with clinical observations, patient history, and epidemiological information. The expected result is Negative.  Fact Sheet for Patients:  01/25/21  Fact Sheet for Healthcare Providers:  BloggerCourse.com  This test is no t yet approved or cleared by the [place] FDA and  has been authorized for detection and/or diagnosis of SARS-CoV-2 by FDA under an Emergency Use Authorization (EUA). This EUA will remain  in effect (meaning this test can be used) for the duration of the COVID-19 declaration under Section 564(b)(1) of the Act, 21 U.S.C.section 360bbb-3(b)(1), unless the authorization is terminated  or revoked sooner.       Influenza A by PCR NEGATIVE NEGATIVE Final   Influenza B by PCR NEGATIVE NEGATIVE Final    Comment: (NOTE) The Xpert Xpress SARS-CoV-2/FLU/RSV plus assay is intended as an aid in the diagnosis of influenza from Nasopharyngeal swab specimens and should not be used as a sole basis for treatment. Nasal washings and aspirates are unacceptable for Xpert Xpress SARS-CoV-2/FLU/RSV testing.  Fact Sheet for Patients: [place]  Fact Sheet  for Healthcare Providers: SeriousBroker.ithttps://www.fda.gov/media/152162/download  This test is not yet approved or cleared by the [place] FDA and has been authorized for detection and/or diagnosis of SARS-CoV-2 by FDA under an Emergency Use Authorization (EUA). This EUA will remain in effect (meaning this test can be used) for the duration of the COVID-19 declaration under Section 564(b)(1) of the Act, 21 U.S.C. section 360bbb-3(b)(1), unless the authorization is terminated or revoked.  Performed at [facility], [address] [place], [place] [address]   Resp Panel by RT-PCR  (Flu A&B, Covid) Nasopharyngeal Swab     Status: None   Collection Time: 01/31/21 10:58 AM   Specimen: Nasopharyngeal Swab; Nasopharyngeal(NP) swabs in vial transport medium  Result Value Ref Range Status   SARS Coronavirus 2 by RT PCR NEGATIVE NEGATIVE Final    Comment: (NOTE) SARS-CoV-2 target nucleic acids are NOT DETECTED.  The SARS-CoV-2 RNA is generally detectable in upper respiratory specimens during the acute phase of infection. The lowest concentration of SARS-CoV-2 viral copies this assay can detect is 138 copies/mL. A negative result does not preclude SARS-Cov-2 infection and should not be used as the sole basis for treatment or other patient management decisions. A negative result may occur with  improper specimen collection/handling, submission of specimen other than nasopharyngeal swab, presence of viral mutation(s) within the areas targeted by this assay, and inadequate number of viral copies(<138 copies/mL). A negative result must be combined with clinical observations, patient history, and epidemiological information. The expected result is Negative.  Fact Sheet for Patients:  BloggerCourse.comhttps://www.fda.gov/media/152166/download  Fact Sheet for Healthcare Providers:  SeriousBroker.ithttps://www.fda.gov/media/152162/download  This test is no t yet approved or cleared by the [place] FDA and  has been authorized for detection and/or diagnosis of SARS-CoV-2 by FDA under an Emergency Use Authorization (EUA). This EUA will remain  in effect (meaning this test can be used) for the duration of the COVID-19 declaration under Section 564(b)(1) of the Act, 21 U.S.C.section 360bbb-3(b)(1), unless the authorization is terminated  or revoked sooner.       Influenza A by PCR NEGATIVE NEGATIVE Final   Influenza B by PCR NEGATIVE NEGATIVE Final    Comment: (NOTE) The Xpert Xpress SARS-CoV-2/FLU/RSV plus assay is intended as an aid in the diagnosis of influenza from Nasopharyngeal swab specimens  and should not be used as a sole basis for treatment. Nasal washings and aspirates are unacceptable for Xpert Xpress SARS-CoV-2/FLU/RSV testing.  Fact Sheet for Patients: BloggerCourse.comhttps://www.fda.gov/media/152166/download  Fact Sheet for Healthcare Providers: SeriousBroker.ithttps://www.fda.gov/media/152162/download  This test is not yet approved or cleared by the [place] FDA and has been authorized for detection and/or diagnosis of SARS-CoV-2 by FDA under an Emergency Use Authorization (EUA). This EUA will remain in effect (meaning this test can be used) for the duration of the COVID-19 declaration under Section 564(b)(1) of the Act, 21 U.S.C. section 360bbb-3(b)(1), unless the authorization is terminated or revoked.  Performed at [facility], [address] [place], [place] [address]          Radiology Studies: DG ABD ACUTE 2+V W 1V CHEST  Result Date: 01/30/2021 CLINICAL DATA:  Pain EXAM: DG ABDOMEN ACUTE WITH 1 VIEW CHEST COMPARISON:  CT 01/23/2021 FINDINGS: There is no evidence of dilated bowel loops or free intraperitoneal air. No radiopaque calculi or other significant radiographic abnormality is seen. Heart size and mediastinal contours are within normal limits. Both lungs are clear. IMPRESSION: Negative abdominal radiographs.  No acute cardiopulmonary disease. Electronically Signed   By:  Jasmine Pang M.D.   On: 01/30/2021 19:57        Scheduled Meds:  enoxaparin (LOVENOX) injection  30 mg Subcutaneous Q24H   Continuous Infusions:  sodium chloride 150 mL/hr at 01/31/21 1058   lactated ringers Stopped (01/31/21 0907)     LOS: 1 day    Time spent: 35 minutes    Ramiro Harvest, MD [facility]   To contact the attending provider between 7A-7P or the covering provider during after hours 7P-7A, please log into the web site [email or web address] and access using universal [place] password for that web site. If you do not have the password, please call  the hospital operator.  01/31/2021, 6:28 PM

## 2021-01-31 NOTE — Progress Notes (Signed)
Requesting full set of vitals before accepting patient to unit.

## 2021-01-31 NOTE — ED Notes (Signed)
Family updated as to patient's status.

## 2021-01-31 NOTE — ED Notes (Signed)
Patient was given his lunch tray.

## 2021-02-01 LAB — COMPREHENSIVE METABOLIC PANEL
ALT: 26 U/L (ref 0–44)
AST: 36 U/L (ref 15–41)
Albumin: 3.8 g/dL (ref 3.5–5.0)
Alkaline Phosphatase: 62 U/L (ref 38–126)
Anion gap: 12 (ref 5–15)
BUN: 10 mg/dL (ref 6–20)
CO2: 25 mmol/L (ref 22–32)
Calcium: 9.4 mg/dL (ref 8.9–10.3)
Chloride: 102 mmol/L (ref 98–111)
Creatinine, Ser: 0.88 mg/dL (ref 0.61–1.24)
GFR, Estimated: >60 mL/min (ref >60)
Glucose, Bld: 101 mg/dL — ABNORMAL HIGH (ref 70–99)
Potassium: 3.5 mmol/L (ref 3.5–5.1)
Sodium: 139 mmol/L (ref 135–145)
Total Bilirubin: 0.9 mg/dL (ref 0.3–1.2)
Total Protein: 7.6 g/dL (ref 6.5–8.1)

## 2021-02-01 LAB — CBC WITH DIFFERENTIAL/PLATELET
Abs Immature Granulocytes: 0.05 K/uL (ref 0.00–0.07)
Basophils Absolute: 0 K/uL (ref 0.0–0.1)
Basophils Relative: 0 %
Eosinophils Absolute: 0.1 K/uL (ref 0.0–0.5)
Eosinophils Relative: 1 %
HCT: 34.7 % — ABNORMAL LOW (ref 39.0–52.0)
Hemoglobin: 11.5 g/dL — ABNORMAL LOW (ref 13.0–17.0)
Immature Granulocytes: 1 %
Lymphocytes Relative: 20 %
Lymphs Abs: 1.9 K/uL (ref 0.7–4.0)
MCH: 30.7 pg (ref 26.0–34.0)
MCHC: 33.1 g/dL (ref 30.0–36.0)
MCV: 92.5 fL (ref 80.0–100.0)
Monocytes Absolute: 1.4 K/uL — ABNORMAL HIGH (ref 0.1–1.0)
Monocytes Relative: 15 %
Neutro Abs: 6 K/uL (ref 1.7–7.7)
Neutrophils Relative %: 63 %
Platelets: 549 K/uL — ABNORMAL HIGH (ref 150–400)
RBC: 3.75 MIL/uL — ABNORMAL LOW (ref 4.22–5.81)
RDW: 13.2 % (ref 11.5–15.5)
WBC: 9.4 K/uL (ref 4.0–10.5)
nRBC: 0 % (ref 0.0–0.2)

## 2021-02-01 LAB — PHOSPHORUS: Phosphorus: 1.7 mg/dL — ABNORMAL LOW (ref 2.5–4.6)

## 2021-02-01 LAB — MAGNESIUM: Magnesium: 2.2 mg/dL (ref 1.7–2.4)

## 2021-02-01 LAB — LIPID PANEL
Cholesterol: 113 mg/dL (ref 0–200)
HDL: 25 mg/dL — ABNORMAL LOW (ref >40)
LDL Cholesterol: 74 mg/dL (ref 0–99)
Total CHOL/HDL Ratio: 4.5 RATIO
Triglycerides: 71 mg/dL (ref <150)
VLDL: 14 mg/dL (ref 0–40)

## 2021-02-01 LAB — LIPASE, BLOOD: Lipase: 300 U/L — ABNORMAL HIGH (ref 11–51)

## 2021-02-01 MED ORDER — HYDROMORPHONE HCL 1 MG/ML IJ SOLN
1.0000 mg | INTRAMUSCULAR | Status: DC | PRN
  Administered 2021-02-01 – 2021-02-02 (×5): 2 mg via INTRAVENOUS
  Filled 2021-02-01 (×5): qty 2

## 2021-02-01 MED ORDER — SODIUM CHLORIDE 0.9 % IV BOLUS
1000.0000 mL | Freq: Once | INTRAVENOUS | Status: AC
  Administered 2021-02-01: 1000 mL via INTRAVENOUS

## 2021-02-01 MED ORDER — POTASSIUM PHOSPHATES 15 MMOLE/5ML IV SOLN
30.0000 mmol | Freq: Once | INTRAVENOUS | Status: AC
  Administered 2021-02-01: 30 mmol via INTRAVENOUS
  Filled 2021-02-01: qty 10

## 2021-02-01 MED ORDER — POLYETHYLENE GLYCOL 3350 17 G PO PACK
17.0000 g | PACK | Freq: Every day | ORAL | Status: DC
  Administered 2021-02-01: 17 g via ORAL
  Filled 2021-02-01: qty 1

## 2021-02-01 MED ORDER — SORBITOL 70 % SOLN
30.0000 mL | Freq: Once | Status: AC
  Administered 2021-02-01: 30 mL via ORAL
  Filled 2021-02-01: qty 30

## 2021-02-01 MED ORDER — SORBITOL 70 % SOLN
30.0000 mL | Freq: Once | Status: DC
  Filled 2021-02-01: qty 30

## 2021-02-01 MED ORDER — POTASSIUM CHLORIDE CRYS ER 10 MEQ PO TBCR
40.0000 meq | EXTENDED_RELEASE_TABLET | Freq: Once | ORAL | Status: AC
  Administered 2021-02-01: 40 meq via ORAL
  Filled 2021-02-01: qty 4

## 2021-02-01 NOTE — Progress Notes (Addendum)
PROGRESS NOTE    Garrett Kane  PIR:[id number] DOB: Feb 05, 1996 DOA: 01/30/2021 PCP: Pcp, No    Chief Complaint  Patient presents with   Abdominal Pain    Brief Narrative:  Patient 25 year old gentleman, prior history of alcohol abuse recently hospitalized for acute pancreatitis 01/23/2021-01/27/2021, presenting back to the ED with significant upper abdominal and back pain.  Patient denied any ongoing alcohol use since last hospitalization.  Patient noted to have elevated lipase level, noted in acute renal failure, and admitted for acute renal failure and acute pancreatitis.  Patient placed on IV fluids and IV pain medication.   Assessment & Plan:   Principal Problem:   Acute pancreatitis Active Problems:   ARF (acute renal failure) (HCC)   Prolonged QT interval   Hyponatremia   1 acute pancreatitis -Patient recently hospitalized 01/23/2021-01/27/2021 for acute pancreatitis secondary to alcohol use. -Patient denies any ongoing alcohol use since recent hospitalization. -Patient presenting with acute abdominal and back pain noted to have elevated lipase level of 580. -CT abdomen and pelvis from 01/23/2021 with normal gallbladder, no visible calcified gallstones, no biliary ductal dilatation. -Fasting lipid panel with a triglyceride level of 71.   -Lipase level trending down  -Patient still with complaints of significant back pain and upper abdominal pain stating pain medication not lasting long enough.   -1 L normal saline bolus.   -Continue normal saline at 150 cc an hour.  -Increase IV Dilaudid to 1 to 2 mg every 4 hours as needed pain.   -Continue clear liquids for now.   -Repeat labs in the morning.  -  2 acute renal failure -Likely secondary to prerenal azotemia in the setting of NSAID use and dehydration secondary to problem #1. -Creatinine on admission was 5.57. -Urinalysis nitrite negative, leukocytes negative, 30 protein. -Renal function improving with aggressive fluid  resuscitation. -Creatinine down to 0.88. -Monitor urine output. -IV fluids.  3.  Hyponatremia -Improved with hydration.    4.  Prolonged QT interval -Repeat EKG with resolution of QT prolongation.  5.  History of alcohol abuse -Alcohol cessation stressed to patient.   -Patient denies any alcohol use since last recent hospitalization.   -Alcohol level on admission negative.   -Continue Ativan withdrawal protocol, thiamine, folic acid, multivitamin.  6.  Hypophosphatemia -K-Phos 30 mmol IV x1. -Repeat labs in the morning.  7.  Constipation -MiraLAX 17 g daily.  Senokot-S twice daily. -Sorbitol 30 cc p.o. x1 and if no results will repeat x1.   DVT prophylaxis: Lovenox Code Status: Full Family Communication: Updated patient.  No family at bedside. Disposition:   Status is: Inpatient  Remains inpatient appropriate because:Inpatient level of care appropriate due to severity of illness  Dispo: The patient is from: Home              Anticipated d/c is to: Home              Patient currently is not medically stable to d/c.   Difficult to place patient No       Consultants:  None  Procedures:  Acute abdominal series 01/30/2021  Antimicrobials:  None   Subjective: Laying in bed waiting for EKG to be done.  Tolerating clears.  No chest pain.  No shortness of breath.  Still with complaints of back pain and abdominal pain which she states have not improved significantly.  States pain medication lasts about 1 to 2 hours and then wears off.    Objective: Vitals:   01/31/21 1811 01/31/21  1948 01/31/21 2354 02/01/21 0647  BP: (!) 144/107  (!) 139/97 129/88  Pulse: 83  84 71  Resp: (!) 22  16 17   Temp: 98.9 F (37.2 C)  98.7 F (37.1 C) 98.2 F (36.8 C)  TempSrc:  Oral    SpO2: 100%  100% 100%  Weight:  63.9 kg    Height:  5\' 8"  (1.727 m)      Intake/Output Summary (Last 24 hours) at 02/01/2021 1157 Last data filed at 02/01/2021 0953 Gross per 24 hour  Intake  5474.03 ml  Output 0 ml  Net 5474.03 ml    Filed Weights   01/31/21 1948  Weight: 63.9 kg    Examination:  General exam: NAD Respiratory system: CTA B.  No wheezes, no crackles, no rhonchi.  Normal respiratory effort.   Cardiovascular system: Regular rate rhythm no murmurs rubs or gallops.  No JVD.  No lower extremity edema.  Gastrointestinal system: Soft, nondistended, tender to palpation in the epigastrium and lower abdominal region.  Positive bowel sounds.  No rebound.  No guarding.  Central nervous system: Alert and oriented.  Moving extremities spontaneously.  No focal neurological deficits.   Extremities: Symmetric 5 x 5 power. Skin: No rashes, lesions or ulcers Psychiatry: Judgement and insight appear normal. Mood & affect appropriate.     Data Reviewed: I have personally reviewed following labs and imaging studies  CBC: Recent Labs  Lab 01/30/21 1852 01/31/21 0520 02/01/21 0445  WBC 9.4 7.6 9.4  NEUTROABS  --   --  6.0  HGB 15.0 12.0* 11.5*  HCT 42.6 35.2* 34.7*  MCV 87.8 88.7 92.5  PLT 686* 591* 549*     Basic Metabolic Panel: Recent Labs  Lab 01/26/21 0344 01/30/21 1852 01/31/21 0520 02/01/21 0445  NA 135 130* 132* 139  K 3.5 3.5 3.7 3.5  CL 96* 82* 93* 102  CO2 26 24 26 25   GLUCOSE 85 158* 123* 101*  BUN 6 54* 33* 10  CREATININE 0.82 5.57* 2.21* 0.88  CALCIUM 8.8* 10.7* 9.3 9.4  MG  --   --   --  2.2  PHOS  --   --   --  1.7*     GFR: Estimated Creatinine Clearance: 118 mL/min (by C-G formula based on SCr of 0.88 mg/dL).  Liver Function Tests: Recent Labs  Lab 01/30/21 1852 02/01/21 0445  AST 38 36  ALT 38 26  ALKPHOS 87 62  BILITOT 0.8 0.9  PROT 10.9* 7.6  ALBUMIN 5.1* 3.8     CBG: No results for input(s): GLUCAP in the last 168 hours.   Recent Results (from the past 240 hour(s))  Resp Panel by RT-PCR (Flu A&B, Covid) Nasopharyngeal Swab     Status: None   Collection Time: 01/23/21  8:05 PM   Specimen: Nasopharyngeal  Swab; Nasopharyngeal(NP) swabs in vial transport medium  Result Value Ref Range Status   SARS Coronavirus 2 by RT PCR NEGATIVE NEGATIVE Final    Comment: (NOTE) SARS-CoV-2 target nucleic acids are NOT DETECTED.  The SARS-CoV-2 RNA is generally detectable in upper respiratory specimens during the acute phase of infection. The lowest concentration of SARS-CoV-2 viral copies this assay can detect is 138 copies/mL. A negative result does not preclude SARS-Cov-2 infection and should not be used as the sole basis for treatment or other patient management decisions. A negative result may occur with  improper specimen collection/handling, submission of specimen other than nasopharyngeal swab, presence of viral mutation(s) within the areas targeted  by this assay, and inadequate number of viral copies(<138 copies/mL). A negative result must be combined with clinical observations, patient history, and epidemiological information. The expected result is Negative.  Fact Sheet for Patients:  BloggerCourse.comhttps://www.fda.gov/media/152166/download  Fact Sheet for Healthcare Providers:  SeriousBroker.ithttps://www.fda.gov/media/152162/download  This test is no t yet approved or cleared by the [place] FDA and  has been authorized for detection and/or diagnosis of SARS-CoV-2 by FDA under an Emergency Use Authorization (EUA). This EUA will remain  in effect (meaning this test can be used) for the duration of the COVID-19 declaration under Section 564(b)(1) of the Act, 21 U.S.C.section 360bbb-3(b)(1), unless the authorization is terminated  or revoked sooner.       Influenza A by PCR NEGATIVE NEGATIVE Final   Influenza B by PCR NEGATIVE NEGATIVE Final    Comment: (NOTE) The Xpert Xpress SARS-CoV-2/FLU/RSV plus assay is intended as an aid in the diagnosis of influenza from Nasopharyngeal swab specimens and should not be used as a sole basis for treatment. Nasal washings and aspirates are unacceptable for Xpert Xpress  SARS-CoV-2/FLU/RSV testing.  Fact Sheet for Patients: BloggerCourse.comhttps://www.fda.gov/media/152166/download  Fact Sheet for Healthcare Providers: SeriousBroker.ithttps://www.fda.gov/media/152162/download  This test is not yet approved or cleared by the [place] FDA and has been authorized for detection and/or diagnosis of SARS-CoV-2 by FDA under an Emergency Use Authorization (EUA). This EUA will remain in effect (meaning this test can be used) for the duration of the COVID-19 declaration under Section 564(b)(1) of the Act, 21 U.S.C. section 360bbb-3(b)(1), unless the authorization is terminated or revoked.  Performed at [facility], [address] [place], [place] [address]   Resp Panel by RT-PCR (Flu A&B, Covid) Nasopharyngeal Swab     Status: None   Collection Time: 01/31/21 10:58 AM   Specimen: Nasopharyngeal Swab; Nasopharyngeal(NP) swabs in vial transport medium  Result Value Ref Range Status   SARS Coronavirus 2 by RT PCR NEGATIVE NEGATIVE Final    Comment: (NOTE) SARS-CoV-2 target nucleic acids are NOT DETECTED.  The SARS-CoV-2 RNA is generally detectable in upper respiratory specimens during the acute phase of infection. The lowest concentration of SARS-CoV-2 viral copies this assay can detect is 138 copies/mL. A negative result does not preclude SARS-Cov-2 infection and should not be used as the sole basis for treatment or other patient management decisions. A negative result may occur with  improper specimen collection/handling, submission of specimen other than nasopharyngeal swab, presence of viral mutation(s) within the areas targeted by this assay, and inadequate number of viral copies(<138 copies/mL). A negative result must be combined with clinical observations, patient history, and epidemiological information. The expected result is Negative.  Fact Sheet for Patients:  BloggerCourse.comhttps://www.fda.gov/media/152166/download  Fact Sheet for Healthcare Providers:   SeriousBroker.ithttps://www.fda.gov/media/152162/download  This test is no t yet approved or cleared by the [place] FDA and  has been authorized for detection and/or diagnosis of SARS-CoV-2 by FDA under an Emergency Use Authorization (EUA). This EUA will remain  in effect (meaning this test can be used) for the duration of the COVID-19 declaration under Section 564(b)(1) of the Act, 21 U.S.C.section 360bbb-3(b)(1), unless the authorization is terminated  or revoked sooner.       Influenza A by PCR NEGATIVE NEGATIVE Final   Influenza B by PCR NEGATIVE NEGATIVE Final    Comment: (NOTE) The Xpert Xpress SARS-CoV-2/FLU/RSV plus assay is intended as an aid in the diagnosis of influenza from Nasopharyngeal swab specimens and should not be used as a sole basis for treatment.  Nasal washings and aspirates are unacceptable for Xpert Xpress SARS-CoV-2/FLU/RSV testing.  Fact Sheet for Patients: BloggerCourse.com  Fact Sheet for Healthcare Providers: SeriousBroker.it  This test is not yet approved or cleared by the [place] FDA and has been authorized for detection and/or diagnosis of SARS-CoV-2 by FDA under an Emergency Use Authorization (EUA). This EUA will remain in effect (meaning this test can be used) for the duration of the COVID-19 declaration under Section 564(b)(1) of the Act, 21 U.S.C. section 360bbb-3(b)(1), unless the authorization is terminated or revoked.  Performed at [facility], [address] [place], [place] [address]           Radiology Studies: DG ABD ACUTE 2+V W 1V CHEST  Result Date: 01/30/2021 CLINICAL DATA:  Pain EXAM: DG ABDOMEN ACUTE WITH 1 VIEW CHEST COMPARISON:  CT 01/23/2021 FINDINGS: There is no evidence of dilated bowel loops or free intraperitoneal air. No radiopaque calculi or other significant radiographic abnormality is seen. Heart size and mediastinal contours are within normal  limits. Both lungs are clear. IMPRESSION: Negative abdominal radiographs.  No acute cardiopulmonary disease. Electronically Signed   By: Jasmine Pang M.D.   On: 01/30/2021 19:57        Scheduled Meds:  enoxaparin (LOVENOX) injection  30 mg Subcutaneous Q24H   folic acid  1 mg Oral Daily   multivitamin with minerals  1 tablet Oral Daily   senna-docusate  1 tablet Oral BID   thiamine  100 mg Oral Daily   Or   thiamine  100 mg Intravenous Daily   Continuous Infusions:  sodium chloride 150 mL/hr at 02/01/21 0528   lactated ringers Stopped (01/31/21 0907)   potassium PHOSPHATE IVPB (in mmol) 30 mmol (02/01/21 0934)     LOS: 2 days    Time spent: 35 minutes    Ramiro Harvest, MD [facility]   To contact the attending provider between 7A-7P or the covering provider during after hours 7P-7A, please log into the web site [email or web address] and access using universal [place] password for that web site. If you do not have the password, please call the hospital operator.  02/01/2021, 11:57 AM

## 2021-02-02 DIAGNOSIS — K5900 Constipation, unspecified: Secondary | ICD-10-CM

## 2021-02-02 LAB — BASIC METABOLIC PANEL
Anion gap: 8 (ref 5–15)
BUN: <5 mg/dL — ABNORMAL LOW (ref 6–20)
CO2: 31 mmol/L (ref 22–32)
Calcium: 9.3 mg/dL (ref 8.9–10.3)
Chloride: 102 mmol/L (ref 98–111)
Creatinine, Ser: 0.8 mg/dL (ref 0.61–1.24)
GFR, Estimated: >60 mL/min (ref >60)
Glucose, Bld: 96 mg/dL (ref 70–99)
Potassium: 3.8 mmol/L (ref 3.5–5.1)
Sodium: 141 mmol/L (ref 135–145)

## 2021-02-02 LAB — PHOSPHORUS: Phosphorus: 2.6 mg/dL (ref 2.5–4.6)

## 2021-02-02 LAB — LIPASE, BLOOD: Lipase: 297 U/L — ABNORMAL HIGH (ref 11–51)

## 2021-02-02 LAB — MAGNESIUM: Magnesium: 1.8 mg/dL (ref 1.7–2.4)

## 2021-02-02 MED ORDER — HYDROMORPHONE HCL 1 MG/ML IJ SOLN
2.0000 mg | INTRAMUSCULAR | Status: DC | PRN
  Administered 2021-02-02 – 2021-02-03 (×5): 2 mg via INTRAVENOUS
  Filled 2021-02-02 (×5): qty 2

## 2021-02-02 MED ORDER — ENOXAPARIN SODIUM 40 MG/0.4ML IJ SOSY
40.0000 mg | PREFILLED_SYRINGE | INTRAMUSCULAR | Status: DC
  Administered 2021-02-02: 40 mg via SUBCUTANEOUS
  Filled 2021-02-02: qty 0.4

## 2021-02-02 MED ORDER — MAGNESIUM SULFATE 2 GM/50ML IV SOLN
2.0000 g | Freq: Once | INTRAVENOUS | Status: AC
  Administered 2021-02-02: 2 g via INTRAVENOUS
  Filled 2021-02-02: qty 50

## 2021-02-02 NOTE — Plan of Care (Signed)

## 2021-02-02 NOTE — Progress Notes (Signed)
PROGRESS NOTE    Garrett Kane  ONG:[id number] DOB: Apr 29, 1996 DOA: 01/30/2021 PCP: Pcp, No    Chief Complaint  Patient presents with   Abdominal Pain    Brief Narrative:  Patient 25 year old gentleman, prior history of alcohol abuse recently hospitalized for acute pancreatitis 01/23/2021-01/27/2021, presenting back to the ED with significant upper abdominal and back pain.  Patient denied any ongoing alcohol use since last hospitalization.  Patient noted to have elevated lipase level, noted in acute renal failure, and admitted for acute renal failure and acute pancreatitis.  Patient placed on IV fluids and IV pain medication.   Assessment & Plan:   Principal Problem:   Acute pancreatitis Active Problems:   ARF (acute renal failure) (HCC)   Prolonged QT interval   Hyponatremia   1 acute pancreatitis -Patient recently hospitalized 01/23/2021-01/27/2021 for acute pancreatitis secondary to alcohol use. -Patient denies any ongoing alcohol use since recent hospitalization. -Patient presenting with acute abdominal and back pain noted to have elevated lipase level of 580. -CT abdomen and pelvis from 01/23/2021 with normal gallbladder, no visible calcified gallstones, no biliary ductal dilatation. -Fasting lipid panel with a triglyceride level of 71.   -Lipase level trending down slowly. -Improving clinically slowly.  Patient still with significant back pain states improvement with upper abdominal pain and flank pain. -Tolerating clears. -Advance diet to full liquid diet. -Decrease IV fluids to 125 cc/h. -Continue current pain regimen. -If continued improvement would advance to a soft diet for breakfast. -Supportive care. -Follow.  2 acute renal failure -Likely secondary to prerenal azotemia in the setting of NSAID use and dehydration secondary to problem #1. -Creatinine on admission was 5.57. -Urinalysis nitrite negative, leukocytes negative, 30 protein. -Renal function improving with  aggressive fluid resuscitation.   -Creatinine down to 0.80.   -Decrease IV fluids to 125 cc an hour.   -Strict I's and O's, daily weights.   -Follow.  3.  Hyponatremia -Improved with hydration.    4.  Prolonged QT interval -Repeat EKG with resolution of QT prolongation.  5.  History of alcohol abuse -Alcohol cessation stressed to patient.   -Patient denies any alcohol use since last recent hospitalization.   -Alcohol level on admission negative.   -Continue Ativan withdrawal protocol, thiamine, folic acid, multivitamin.  6.  Hypophosphatemia -Repleted.   -Phosphorus at 2.6.  7.  Constipation -Patient with bowel movements on current bowel regimen of MiraLAX daily, Senokot-S twice daily.    DVT prophylaxis: Lovenox Code Status: Full Family Communication: Updated patient.  No family at bedside. Disposition:   Status is: Inpatient  Remains inpatient appropriate because:Inpatient level of care appropriate due to severity of illness  Dispo: The patient is from: Home              Anticipated d/c is to: Home              Patient currently is not medically stable to d/c.   Difficult to place patient No       Consultants:  None  Procedures:  Acute abdominal series 01/30/2021  Antimicrobials:  None   Subjective: Laying in bed.  States some improvement with abdominal and flank pain.  Still with complaints of significant back pain.  States I just went with pain regimen has helped with his back pain.  Tolerating clears.  No chest pain.  No shortness of breath.    Objective: Vitals:   02/01/21 0647 02/01/21 1351 02/01/21 1926 02/02/21 0611  BP: 129/88 (!) 129/102 128/84 (!) 130/95  Pulse: 71 70 79 80  Resp: 17 18 18 20   Temp: 98.2 F (36.8 C) 98.8 F (37.1 C) 98 F (36.7 C) 98.6 F (37 C)  TempSrc:  Oral Oral Oral  SpO2: 100% 100% 100% 100%  Weight:      Height:        Intake/Output Summary (Last 24 hours) at 02/02/2021 1209 Last data filed at 02/02/2021  0900 Gross per 24 hour  Intake 5883.14 ml  Output 1550 ml  Net 4333.14 ml    Filed Weights   01/31/21 1948  Weight: 63.9 kg    Examination:  General exam: : NAD Respiratory system: CTA B.  No wheezes, no rhonchi.  Speaking in full sentences.  Normal respiratory effort. Cardiovascular system: Regular rate and rhythm no murmurs rubs or gallops.  No JVD.  No lower extremity edema.  Gastrointestinal system: Abdomen soft, nontender, nondistended, positive bowel sounds.  No rebound.  No guarding. Central nervous system: Alert and oriented. No focal neurological deficits. Extremities: Symmetric 5 x 5 power. Skin: No rashes, lesions or ulcers Psychiatry: Judgement and insight appear normal. Mood & affect appropriate.   Data Reviewed: I have personally reviewed following labs and imaging studies  CBC: Recent Labs  Lab 01/30/21 1852 01/31/21 0520 02/01/21 0445  WBC 9.4 7.6 9.4  NEUTROABS  --   --  6.0  HGB 15.0 12.0* 11.5*  HCT 42.6 35.2* 34.7*  MCV 87.8 88.7 92.5  PLT 686* 591* 549*     Basic Metabolic Panel: Recent Labs  Lab 01/30/21 1852 01/31/21 0520 02/01/21 0445 02/02/21 0512  NA 130* 132* 139 141  K 3.5 3.7 3.5 3.8  CL 82* 93* 102 102  CO2 24 26 25 31   GLUCOSE 158* 123* 101* 96  BUN 54* 33* 10 <5*  CREATININE 5.57* 2.21* 0.88 0.80  CALCIUM 10.7* 9.3 9.4 9.3  MG  --   --  2.2 1.8  PHOS  --   --  1.7* 2.6     GFR: Estimated Creatinine Clearance: 129.8 mL/min (by C-G formula based on SCr of 0.8 mg/dL).  Liver Function Tests: Recent Labs  Lab 01/30/21 1852 02/01/21 0445  AST 38 36  ALT 38 26  ALKPHOS 87 62  BILITOT 0.8 0.9  PROT 10.9* 7.6  ALBUMIN 5.1* 3.8     CBG: No results for input(s): GLUCAP in the last 168 hours.   Recent Results (from the past 240 hour(s))  Resp Panel by RT-PCR (Flu A&B, Covid) Nasopharyngeal Swab     Status: None   Collection Time: 01/23/21  8:05 PM   Specimen: Nasopharyngeal Swab; Nasopharyngeal(NP) swabs in vial  transport medium  Result Value Ref Range Status   SARS Coronavirus 2 by RT PCR NEGATIVE NEGATIVE Final    Comment: (NOTE) SARS-CoV-2 target nucleic acids are NOT DETECTED.  The SARS-CoV-2 RNA is generally detectable in upper respiratory specimens during the acute phase of infection. The lowest concentration of SARS-CoV-2 viral copies this assay can detect is 138 copies/mL. A negative result does not preclude SARS-Cov-2 infection and should not be used as the sole basis for treatment or other patient management decisions. A negative result may occur with  improper specimen collection/handling, submission of specimen other than nasopharyngeal swab, presence of viral mutation(s) within the areas targeted by this assay, and inadequate number of viral copies(<138 copies/mL). A negative result must be combined with clinical observations, patient history, and epidemiological information. The expected result is Negative.  Fact Sheet for Patients:  04/04/21  Fact Sheet for Healthcare Providers:  SeriousBroker.it  This test is no t yet approved or cleared by the [place] FDA and  has been authorized for detection and/or diagnosis of SARS-CoV-2 by FDA under an Emergency Use Authorization (EUA). This EUA will remain  in effect (meaning this test can be used) for the duration of the COVID-19 declaration under Section 564(b)(1) of the Act, 21 U.S.C.section 360bbb-3(b)(1), unless the authorization is terminated  or revoked sooner.       Influenza A by PCR NEGATIVE NEGATIVE Final   Influenza B by PCR NEGATIVE NEGATIVE Final    Comment: (NOTE) The Xpert Xpress SARS-CoV-2/FLU/RSV plus assay is intended as an aid in the diagnosis of influenza from Nasopharyngeal swab specimens and should not be used as a sole basis for treatment. Nasal washings and aspirates are unacceptable for Xpert Xpress SARS-CoV-2/FLU/RSV testing.  Fact  Sheet for Patients: BloggerCourse.com  Fact Sheet for Healthcare Providers: SeriousBroker.it  This test is not yet approved or cleared by the [place] FDA and has been authorized for detection and/or diagnosis of SARS-CoV-2 by FDA under an Emergency Use Authorization (EUA). This EUA will remain in effect (meaning this test can be used) for the duration of the COVID-19 declaration under Section 564(b)(1) of the Act, 21 U.S.C. section 360bbb-3(b)(1), unless the authorization is terminated or revoked.  Performed at [facility], [address] [place], [place] [address]   Resp Panel by RT-PCR (Flu A&B, Covid) Nasopharyngeal Swab     Status: None   Collection Time: 01/31/21 10:58 AM   Specimen: Nasopharyngeal Swab; Nasopharyngeal(NP) swabs in vial transport medium  Result Value Ref Range Status   SARS Coronavirus 2 by RT PCR NEGATIVE NEGATIVE Final    Comment: (NOTE) SARS-CoV-2 target nucleic acids are NOT DETECTED.  The SARS-CoV-2 RNA is generally detectable in upper respiratory specimens during the acute phase of infection. The lowest concentration of SARS-CoV-2 viral copies this assay can detect is 138 copies/mL. A negative result does not preclude SARS-Cov-2 infection and should not be used as the sole basis for treatment or other patient management decisions. A negative result may occur with  improper specimen collection/handling, submission of specimen other than nasopharyngeal swab, presence of viral mutation(s) within the areas targeted by this assay, and inadequate number of viral copies(<138 copies/mL). A negative result must be combined with clinical observations, patient history, and epidemiological information. The expected result is Negative.  Fact Sheet for Patients:  BloggerCourse.com  Fact Sheet for Healthcare Providers:   SeriousBroker.it  This test is no t yet approved or cleared by the [place] FDA and  has been authorized for detection and/or diagnosis of SARS-CoV-2 by FDA under an Emergency Use Authorization (EUA). This EUA will remain  in effect (meaning this test can be used) for the duration of the COVID-19 declaration under Section 564(b)(1) of the Act, 21 U.S.C.section 360bbb-3(b)(1), unless the authorization is terminated  or revoked sooner.       Influenza A by PCR NEGATIVE NEGATIVE Final   Influenza B by PCR NEGATIVE NEGATIVE Final    Comment: (NOTE) The Xpert Xpress SARS-CoV-2/FLU/RSV plus assay is intended as an aid in the diagnosis of influenza from Nasopharyngeal swab specimens and should not be used as a sole basis for treatment. Nasal washings and aspirates are unacceptable for Xpert Xpress SARS-CoV-2/FLU/RSV testing.  Fact Sheet for Patients: BloggerCourse.com  Fact Sheet for Healthcare Providers: SeriousBroker.it  This test is not yet approved or cleared by the [place]  FDA and has been authorized for detection and/or diagnosis of SARS-CoV-2 by FDA under an Emergency Use Authorization (EUA). This EUA will remain in effect (meaning this test can be used) for the duration of the COVID-19 declaration under Section 564(b)(1) of the Act, 21 U.S.C. section 360bbb-3(b)(1), unless the authorization is terminated or revoked.  Performed at [facility], [address] [place], [place] [id number]           Radiology Studies: No results found.      Scheduled Meds:  enoxaparin (LOVENOX) injection  30 mg Subcutaneous Q24H   folic acid  1 mg Oral Daily   multivitamin with minerals  1 tablet Oral Daily   polyethylene glycol  17 g Oral Daily   senna-docusate  1 tablet Oral BID   thiamine  100 mg Oral Daily   Or   thiamine  100 mg Intravenous Daily   Continuous  Infusions:  sodium chloride 125 mL/hr at 02/02/21 0837     LOS: 3 days    Time spent: 35 minutes    Ramiro Harvestaniel Venus Ruhe, MD [facility]   To contact the attending provider between 7A-7P or the covering provider during after hours 7P-7A, please log into the web site [email or web address] and access using universal [place] password for that web site. If you do not have the password, please call the hospital operator.  02/02/2021, 12:09 PM

## 2021-02-02 NOTE — TOC Initial Note (Signed)
Transition of Care [facility]) - Initial/Assessment Note    Patient Details  Name: Garrett Kane MRN: [id number] Date of Birth: March 06, 1996  [facility] CM/SW Contact:    Ida Rogue, LCSW Phone Number: 02/02/2021, 3:53 PM  Clinical Narrative:    Patient seen in follow up to MD consult for substance abuse, in this case alcohol.  Garrett Kane is a recent graduate of [employer], will live in his apartment here until lease runs out at end of July, at which time he will return to [place] and live with his mother until he is able to save money from working to move out on his own.  He admits to regular "partying" since graduation, more than when a student.  He often goes out with friends, and continues to drink liquor upon return home. Garrett Kane wants to cut back in drinking, and admits his biggest challenges to overcome are the fact that his father is an alcoholic, and he feels like he needs alcohol to sleep.  He was educated in good sleep hygiene, to anticipate that he will be tempted to drink to help him sleep, and to use his supports of girl friend and mother when he has "urges" to drink.  I also encouraged him to seek out an AA meeting to check it out, and to get a temporary sponsor as another support. No further needs identified. TOC will continue to follow during the course of hospitalization.               Expected Discharge Plan: Home/Self Care Barriers to Discharge: No Barriers Identified   Patient Goals and CMS Choice        Expected Discharge Plan and Services Expected Discharge Plan: Home/Self Care In-house Referral: Clinical Social Work     Living arrangements for the past 2 months: Apartment Expected Discharge Date:  (unknown)                                    Prior Living Arrangements/Services Living arrangements for the past 2 months: Apartment Lives with:: Roommate Patient language and need for interpreter reviewed:: Yes Do you feel safe  going back to the place where you live?: Yes      Need for Family Participation in Patient Care: Yes (Comment) Care giver support system in place?: Yes (comment)   Criminal Activity/Legal Involvement Pertinent to Current Situation/Hospitalization: No - Comment as needed  Activities of Daily Living Home Assistive Devices/Equipment: None ADL Screening (condition at time of admission) Patient's cognitive ability adequate to safely complete daily activities?: Yes Is the patient deaf or have difficulty hearing?: No Does the patient have difficulty seeing, even when wearing glasses/contacts?: No Does the patient have difficulty concentrating, remembering, or making decisions?: No Patient able to express need for assistance with ADLs?: Yes Does the patient have difficulty dressing or bathing?: No Independently performs ADLs?: Yes (appropriate for developmental age) Does the patient have difficulty walking or climbing stairs?: No Weakness of Legs: None Weakness of Arms/Hands: None  Permission Sought/Granted                  Emotional Assessment Appearance:: Appears stated age Attitude/Demeanor/Rapport: Engaged Affect (typically observed): Appropriate Orientation: : Oriented to Self, Oriented to Place, Oriented to  Time, Oriented to Situation Alcohol / Substance Use: Alcohol Use Psych Involvement: No (comment)  Admission diagnosis:  ARF (acute renal failure) (HCC) [N17.9] AKI (  acute kidney injury) [facility]) [N17.9] Patient Active Problem List   Diagnosis Date Noted   Hyponatremia 01/31/2021   ARF (acute renal failure) (HCC) 01/30/2021   Prolonged QT interval 01/30/2021   Acute pancreatitis 01/24/2021   Acute pancreatitis without infection or necrosis, unspecified pancreatitis type 01/23/2021   PCP:  Pcp, No Pharmacy:   [id number] [place], [place] - [address] AT [facility] [address] [place] [address] Phone: [phone]  Fax: [phone]     Social Determinants of Health (SDOH) Interventions    Readmission Risk Interventions No flowsheet data found.

## 2021-02-03 LAB — MAGNESIUM: Magnesium: 1.9 mg/dL (ref 1.7–2.4)

## 2021-02-03 LAB — RENAL FUNCTION PANEL
Albumin: 3.6 g/dL (ref 3.5–5.0)
Anion gap: 9 (ref 5–15)
BUN: 5 mg/dL — ABNORMAL LOW (ref 6–20)
CO2: 27 mmol/L (ref 22–32)
Calcium: 9 mg/dL (ref 8.9–10.3)
Chloride: 102 mmol/L (ref 98–111)
Creatinine, Ser: 0.74 mg/dL (ref 0.61–1.24)
GFR, Estimated: >60 mL/min (ref >60)
Glucose, Bld: 141 mg/dL — ABNORMAL HIGH (ref 70–99)
Phosphorus: 2.4 mg/dL — ABNORMAL LOW (ref 2.5–4.6)
Potassium: 3.6 mmol/L (ref 3.5–5.1)
Sodium: 138 mmol/L (ref 135–145)

## 2021-02-03 LAB — CBC
HCT: 34.6 % — ABNORMAL LOW (ref 39.0–52.0)
Hemoglobin: 11.1 g/dL — ABNORMAL LOW (ref 13.0–17.0)
MCH: 30.1 pg (ref 26.0–34.0)
MCHC: 32.1 g/dL (ref 30.0–36.0)
MCV: 93.8 fL (ref 80.0–100.0)
Platelets: 638 10*3/uL — ABNORMAL HIGH (ref 150–400)
RBC: 3.69 MIL/uL — ABNORMAL LOW (ref 4.22–5.81)
RDW: 13.2 % (ref 11.5–15.5)
WBC: 7.8 10*3/uL (ref 4.0–10.5)
nRBC: 0 % (ref 0.0–0.2)

## 2021-02-03 LAB — LIPASE, BLOOD: Lipase: 253 U/L — ABNORMAL HIGH (ref 11–51)

## 2021-02-03 MED ORDER — HYDROMORPHONE HCL 1 MG/ML IJ SOLN: 2.0000 mg | Freq: Four times a day (QID) | INTRAMUSCULAR | Status: DC | PRN

## 2021-02-03 MED ORDER — K PHOS MONO-SOD PHOS DI & MONO 155-852-130 MG PO TABS: 250.0000 mg | ORAL_TABLET | Freq: Two times a day (BID) | ORAL | 0 refills | Status: AC

## 2021-02-03 MED ORDER — TRAMADOL HCL 50 MG PO TABS
100.0000 mg | ORAL_TABLET | Freq: Four times a day (QID) | ORAL | Status: DC | PRN
  Administered 2021-02-03 (×2): 100 mg via ORAL
  Filled 2021-02-03 (×2): qty 2

## 2021-02-03 MED ORDER — POTASSIUM CHLORIDE CRYS ER 10 MEQ PO TBCR
40.0000 meq | EXTENDED_RELEASE_TABLET | Freq: Once | ORAL | Status: AC
  Administered 2021-02-03: 40 meq via ORAL
  Filled 2021-02-03: qty 4

## 2021-02-03 MED ORDER — K PHOS MONO-SOD PHOS DI & MONO 155-852-130 MG PO TABS
250.0000 mg | ORAL_TABLET | Freq: Two times a day (BID) | ORAL | Status: DC
  Administered 2021-02-03: 250 mg via ORAL
  Filled 2021-02-03 (×2): qty 1

## 2021-02-03 MED ORDER — ACETAMINOPHEN 325 MG PO TABS: 650.0000 mg | ORAL_TABLET | Freq: Four times a day (QID) | ORAL | Status: AC | PRN

## 2021-02-03 MED ORDER — TRAMADOL HCL 50 MG PO TABS: 100.0000 mg | ORAL_TABLET | Freq: Four times a day (QID) | ORAL | 0 refills | Status: AC | PRN

## 2021-02-03 NOTE — Progress Notes (Signed)
Pt leaving this evening. Alert and oriented. Will have CM contact patient tomorrow concerning locating a PCP for patient. Discharge instructions given/explained with pt verbalizing understanding.

## 2021-02-03 NOTE — Discharge Summary (Signed)
Physician Discharge Summary  Garrett Kane ZOX:[id number] DOB: 11-04-1995 DOA: 01/30/2021  PCP: Pcp, No  Admit date: 01/30/2021 Discharge date: 02/03/2021  Time spent: 55 minutes  Recommendations for Outpatient Follow-up:  Follow-up with PCP/MD in 2 weeks.  On follow-up patient will need a basic metabolic profile done to follow-up on electrolytes and renal function, magnesium level, phosphorus level checked.   Discharge Diagnoses:  Principal Problem:   Acute pancreatitis Active Problems:   ARF (acute renal failure) (HCC)   Prolonged QT interval   Hyponatremia   Discharge Condition: Stable and improved  Diet recommendation: Heart healthy  Filed Weights   01/31/21 1948  Weight: 63.9 kg    History of present illness:  HPI per Dr. Vela Kane is a 25 y.o. male with medical history significant for recent pancreatitis, daily marijuana use who presented with abdominal pain with nausea and vomiting.  He was in the hospital last week for acute pancreatitis secondary to alcohol abuse.  He was discharged on Saturday afternoon.  He reported that he has not been drinking any alcohol since his discharge.  He stated on Sunday his abdominal pain worsened and became more severe and sharp like it did when he was admitted.  States that the worst his pain was an 8-9 out of 10.  He had nausea and vomiting with the abdominal pain was not able to keep any food or fluids down.  He was taking tramadol and ibuprofen at home for the pain which was not helping he states.  He stated he had been drinking approximately a pint of liquor every day when he was admitted last time but he has not had anything to drink in over a week week.  He does state he smokes marijuana daily.  Reports he has vomited multiple times in the last 24 to 48 hours.  He denied any injury or trauma to his abdomen.  He has not had any fever or chills.  He denied diarrhea.  He denied any urinary symptoms.   ED Course: The emergency room  he has been hemodynamically stable.  He has significant abdominal pain to palpation of his epigastric and left upper quadrant abdomen.  He was given a liter of fluid IV in the emergency room.  His labs show sodium of 130, potassium 3.5, chloride 82, bicarb 24, creatinine 5.57 which is increased from 0.82 and a BUN of 54 which is increased from 6.  Glucose is 158.  Lipase is 580.  Alkaline phosphatase 87, AST 38, ALT 38.  CBC shows thrombocytosis with a platelet count of 686,000 elevated from 260,000 300,000 when he was admitted last week.  Hospitalist service was asked to admit for further management.  ER physician discussed with nephrology who recommended IV fluids overnight if his creatinine does not improve by morning to officially consult nephrology for evaluation tomorrow.  Hospital Course:  1 acute pancreatitis -Patient recently hospitalized 01/23/2021-01/27/2021 for acute pancreatitis secondary to alcohol use. -Patient denied any ongoing alcohol use since recent hospitalization. -Patient presented with acute abdominal and back pain noted to have elevated lipase level of 580. -CT abdomen and pelvis from 01/23/2021 with normal gallbladder, no visible calcified gallstones, no biliary ductal dilatation. -Fasting lipid panel with a triglyceride level of 71.   -Patient aggressively hydrated with IV fluid with clinical improvement and lipase levels trending down. -Patient initially placed on clears and as patient improved clinically with aggressive hydration was transitioned to full liquid diet and subsequently a soft diet which he tolerated.  Patient had no further nausea or vomiting.  Patient did have some complaints of back pain however epigastric pain had resolved by day of discharge. -Patient was discharged in stable and improved condition.   2 acute renal failure -Likely secondary to prerenal azotemia in the setting of NSAID use and dehydration secondary to problem #1. -Creatinine on admission was  5.57. -Urinalysis nitrite negative, leukocytes negative, 30 protein. -Renal function improved with aggressive fluid resuscitation and acute renal failure had resolved by day of discharge. -Creatinine on day of discharge was 0.74. -Outpatient follow-up.  3.  Hyponatremia -Improved with hydration.    4.  Prolonged QT interval -Repeat EKG with resolution of QT prolongation.  5.  History of alcohol abuse -Alcohol cessation stressed to patient.   -Patient denied any alcohol use since last recent hospitalization.   -Alcohol level on admission negative.   -Patient had no signs of withdrawal during the hospitalization and was placed on Ativan withdrawal protocol, thiamine, folic acid, multivitamin.    6.  Hypophosphatemia -Improved. -Patient was discharged on 2 more days of oral phosphorus supplementation.  7.  Constipation -Patient placed on MiraLAX daily as well as Senokot-S with good results.   -Outpatient follow-up.        Procedures: Acute abdominal series 01/30/2021  Consultations: None  Discharge Exam: Vitals:   02/03/21 0537 02/03/21 1419  BP: (!) 140/101 (!) 138/94  Pulse: 73 98  Resp: 16   Temp: 98.1 F (36.7 C)   SpO2: 100% 100%    General: NAD Cardiovascular: RRR.  No murmurs rubs or gallops.  No JVD.  No lower extremity edema. Respiratory: Clear to auscultation bilaterally.  No wheezes, no crackles, no rhonchi.  Normal respiratory effort.  Discharge Instructions   Discharge Instructions     Diet - low sodium heart healthy   Complete by: As directed    Increase activity slowly   Complete by: As directed       Allergies as of 02/03/2021   No Known Allergies      Medication List     STOP taking these medications    ibuprofen 200 MG tablet Commonly known as: ADVIL       TAKE these medications    acetaminophen 325 MG tablet Commonly known as: TYLENOL Take 2 tablets (650 mg total) by mouth every 6 (six) hours as needed for mild pain (or  Fever >/= 101).   folic acid 1 MG tablet Commonly known as: FOLVITE Take 1 tablet (1 mg total) by mouth daily.   omeprazole 20 MG capsule Commonly known as: PRILOSEC Take 20 mg by mouth daily.   phosphorus 155-852-130 MG tablet Commonly known as: K PHOS NEUTRAL Take 1 tablet (250 mg total) by mouth 2 (two) times daily for 2 days.   thiamine 100 MG tablet Take 1 tablet (100 mg total) by mouth daily.   traMADol 50 MG tablet Commonly known as: ULTRAM Take 2 tablets (100 mg total) by mouth every 6 (six) hours as needed for moderate pain. What changed:  how much to take reasons to take this       No Known Allergies  Follow-up Information     PCP. Schedule an appointment as soon as possible for a visit in 2 week(s).                   The results of significant diagnostics from this hospitalization (including imaging, microbiology, ancillary and laboratory) are listed below for reference.    Significant Diagnostic  Studies: CT ABDOMEN PELVIS W CONTRAST  Result Date: 01/23/2021 CLINICAL DATA:  Severe epigastric pain, concern for pancreatitis EXAM: CT ABDOMEN AND PELVIS WITH CONTRAST TECHNIQUE: Multidetector CT imaging of the abdomen and pelvis was performed using the standard protocol following bolus administration of intravenous contrast. CONTRAST:  OMNIPAQUE IOHEXOL 300 MG/ML  SOLN COMPARISON:  None. FINDINGS: Lower chest: Lung bases are clear. Normal heart size. No pericardial effusion. Hepatobiliary: No worrisome focal liver abnormality is seen. Normal gallbladder. No visible calcified gallstones. No biliary ductal dilatation. Pancreas: Diffusely edematous appearance of the pancreas with some slight heterogeneity towards the pancreatic tail. Surrounding edematous peripancreatic fluid is noted. No large regions of necrosis. No pancreatic ductal dilatation. No organized abscess or collection. Spleen: Normal in size. No concerning splenic lesions. Adrenals/Urinary Tract:  Normal adrenals. Kidneys are normally located with symmetric enhancement. No suspicious renal lesion, urolithiasis or hydronephrosis. Urinary bladder is largely decompressed at the time of exam and therefore poorly evaluated by CT imaging. Stomach/Bowel: Distal esophagus and stomach are unremarkable. Thickened and heterogeneous appearance of the duodenal sweep, particularly towards the head and uncinate of the pancreas may be reactive duodenitis. No other small bowel thickening or dilatation. Some mild edematous changes are seen along the colon, particularly within the retroperitoneal segments which may be reactive to the peripancreatic inflammatory change. Normal appendix. No evidence of obstruction. Vascular/Lymphatic: No significant vascular findings are present. No enlarged abdominal or pelvic lymph nodes. Reproductive: The prostate and seminal vesicles are unremarkable. Other: With peripancreatic edematous fluid also with fluid tracking in the retroperitoneum and along the right pericolic gutter. No free air. No bowel containing hernia. Musculoskeletal: No acute osseous abnormality or suspicious osseous lesion. IMPRESSION: 1. Features most consistent with an acute interstitial edematous pancreatitis. Some slight heterogeneity of the pancreatic tail is more nonspecific without large region pancreatic necrosis or organized collection at this time. Adjacent edematous features and likely reactive fluid throughout the retroperitoneum and tracking into right pericolic gutter. Recommend correlation with lipase. 2. Thickening and heterogeneity of the duodenal sweep, particularly the 2nd to 3rd portion as it crosses adjacent the duodenal head and uncinate is favored to be reactive. Additional likely reactive edematous thickening of the colon as well. Could correlate with clinical assessment of bowel symptoms Electronically Signed   By: Kreg Shropshire M.D.   On: 01/23/2021 15:18   DG ABD ACUTE 2+V W 1V CHEST  Result  Date: 01/30/2021 CLINICAL DATA:  Pain EXAM: DG ABDOMEN ACUTE WITH 1 VIEW CHEST COMPARISON:  CT 01/23/2021 FINDINGS: There is no evidence of dilated bowel loops or free intraperitoneal air. No radiopaque calculi or other significant radiographic abnormality is seen. Heart size and mediastinal contours are within normal limits. Both lungs are clear. IMPRESSION: Negative abdominal radiographs.  No acute cardiopulmonary disease. Electronically Signed   By: Jasmine Pang M.D.   On: 01/30/2021 19:57    Microbiology: Recent Results (from the past 240 hour(s))  Resp Panel by RT-PCR (Flu A&B, Covid) Nasopharyngeal Swab     Status: None   Collection Time: 01/31/21 10:58 AM   Specimen: Nasopharyngeal Swab; Nasopharyngeal(NP) swabs in vial transport medium  Result Value Ref Range Status   SARS Coronavirus 2 by RT PCR NEGATIVE NEGATIVE Final    Comment: (NOTE) SARS-CoV-2 target nucleic acids are NOT DETECTED.  The SARS-CoV-2 RNA is generally detectable in upper respiratory specimens during the acute phase of infection. The lowest concentration of SARS-CoV-2 viral copies this assay can detect is 138 copies/mL. A negative result does not  preclude SARS-Cov-2 infection and should not be used as the sole basis for treatment or other patient management decisions. A negative result may occur with  improper specimen collection/handling, submission of specimen other than nasopharyngeal swab, presence of viral mutation(s) within the areas targeted by this assay, and inadequate number of viral copies(<138 copies/mL). A negative result must be combined with clinical observations, patient history, and epidemiological information. The expected result is Negative.  Fact Sheet for Patients:  BloggerCourse.com  Fact Sheet for Healthcare Providers:  SeriousBroker.it  This test is no t yet approved or cleared by the [place] FDA and  has been authorized for  detection and/or diagnosis of SARS-CoV-2 by FDA under an Emergency Use Authorization (EUA). This EUA will remain  in effect (meaning this test can be used) for the duration of the COVID-19 declaration under Section 564(b)(1) of the Act, 21 U.S.C.section 360bbb-3(b)(1), unless the authorization is terminated  or revoked sooner.       Influenza A by PCR NEGATIVE NEGATIVE Final   Influenza B by PCR NEGATIVE NEGATIVE Final    Comment: (NOTE) The Xpert Xpress SARS-CoV-2/FLU/RSV plus assay is intended as an aid in the diagnosis of influenza from Nasopharyngeal swab specimens and should not be used as a sole basis for treatment. Nasal washings and aspirates are unacceptable for Xpert Xpress SARS-CoV-2/FLU/RSV testing.  Fact Sheet for Patients: BloggerCourse.com  Fact Sheet for Healthcare Providers: SeriousBroker.it  This test is not yet approved or cleared by the [place] FDA and has been authorized for detection and/or diagnosis of SARS-CoV-2 by FDA under an Emergency Use Authorization (EUA). This EUA will remain in effect (meaning this test can be used) for the duration of the COVID-19 declaration under Section 564(b)(1) of the Act, 21 U.S.C. section 360bbb-3(b)(1), unless the authorization is terminated or revoked.  Performed at [facility], [address] [place], [place] [address]      Labs: Basic Metabolic Panel: Recent Labs  Lab 01/30/21 1852 01/31/21 0520 02/01/21 0445 02/02/21 0512 02/03/21 0455  NA 130* 132* 139 141 138  K 3.5 3.7 3.5 3.8 3.6  CL 82* 93* 102 102 102  CO2 24 26 25 31 27   GLUCOSE 158* 123* 101* 96 141*  BUN 54* 33* 10 <5* 5*  CREATININE 5.57* 2.21* 0.88 0.80 0.74  CALCIUM 10.7* 9.3 9.4 9.3 9.0  MG  --   --  2.2 1.8 1.9  PHOS  --   --  1.7* 2.6 2.4*   Liver Function Tests: Recent Labs  Lab 01/30/21 1852 02/01/21 0445 02/03/21 0455  AST 38 36  --   ALT 38 26  --    ALKPHOS 87 62  --   BILITOT 0.8 0.9  --   PROT 10.9* 7.6  --   ALBUMIN 5.1* 3.8 3.6   Recent Labs  Lab 01/30/21 1852 02/01/21 0445 02/02/21 0512 02/03/21 0455  LIPASE 580* 300* 297* 253*   No results for input(s): AMMONIA in the last 168 hours. CBC: Recent Labs  Lab 01/30/21 1852 01/31/21 0520 02/01/21 0445 02/03/21 0455  WBC 9.4 7.6 9.4 7.8  NEUTROABS  --   --  6.0  --   HGB 15.0 12.0* 11.5* 11.1*  HCT 42.6 35.2* 34.7* 34.6*  MCV 87.8 88.7 92.5 93.8  PLT 686* 591* 549* 638*   Cardiac Enzymes: No results for input(s): CKTOTAL, CKMB, CKMBINDEX, TROPONINI in the last 168 hours. BNP: BNP (last 3 results) No results for input(s): BNP in the last 8760  hours.  ProBNP (last 3 results) No results for input(s): PROBNP in the last 8760 hours.  CBG: No results for input(s): GLUCAP in the last 168 hours.     Signed:  Ramiro Harvestaniel Nori Poland MD.  [facility] 02/03/2021, 5:43 PM

## 2021-02-03 NOTE — Plan of Care (Signed)

## 2021-02-04 NOTE — Progress Notes (Signed)
Alerted by RN that pt requesting assistance with PCP.  Have left a VM on pt's phone explaining that, since he has active insurance with [employer], then [facility] would not assist with this.  He needs to refer to the network list of providers in his area and make these arrangements himself.  Tayana Shankle, LCSW

## 2021-10-29 IMAGING — CT CT ABD-PELV W/ CM
2 of 4 series · 15 of 46 positions shown, 17 images · IV contrast (OMNIPAQUE 300)
Comparison: None.

CLINICAL DATA: Severe epigastric pain, concern for pancreatitis

EXAM:
CT ABDOMEN AND PELVIS WITH CONTRAST
TECHNIQUE: Multidetector CT imaging of the abdomen and pelvis was performed
using the standard protocol following bolus administration of
intravenous contrast.
CONTRAST:  100mL OMNIPAQUE IOHEXOL 300 MG/ML  SOLN

[Series 2: axial st · axial · 0.67mm/px · z∈[+1171,+1561]mm · 12 of 88 slices shown, 14 images]
[im 5/88  soft-tissue]
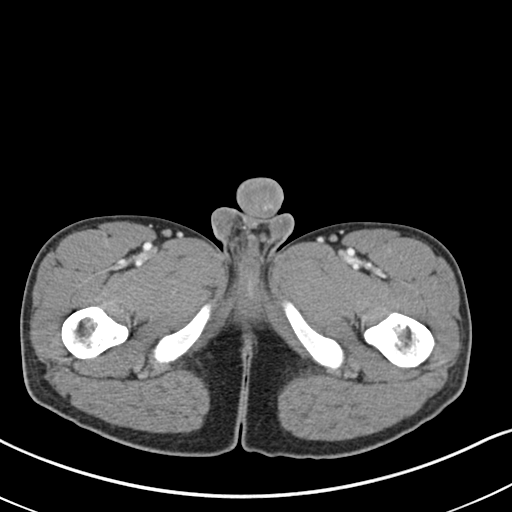
[im 5/88  bone]
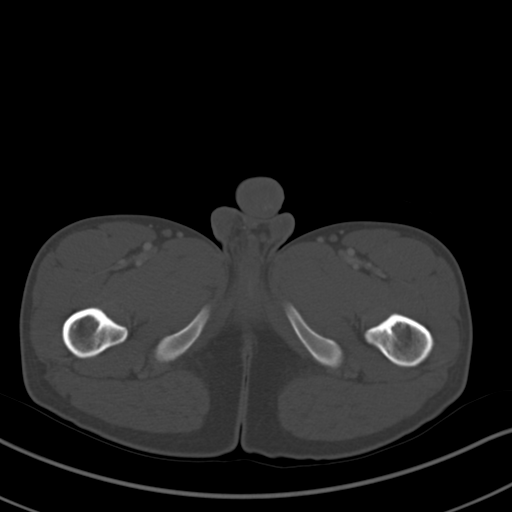
[im 15/88  soft-tissue]
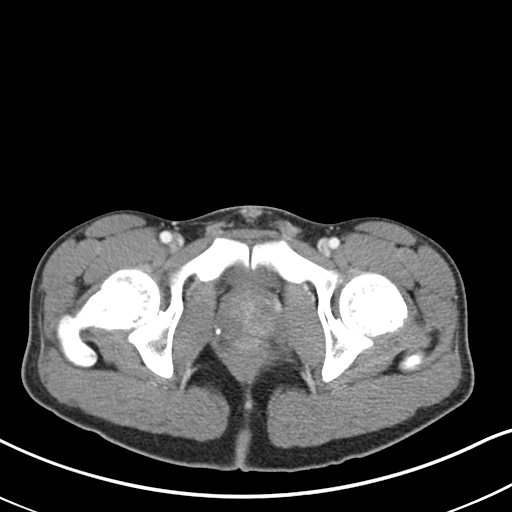
[im 20/88  soft-tissue]
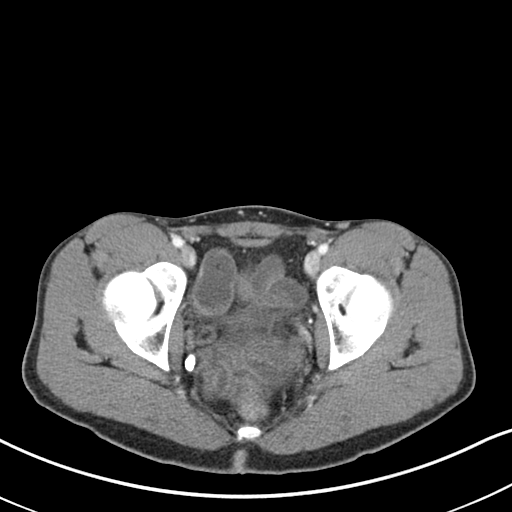
[im 25/88  soft-tissue]
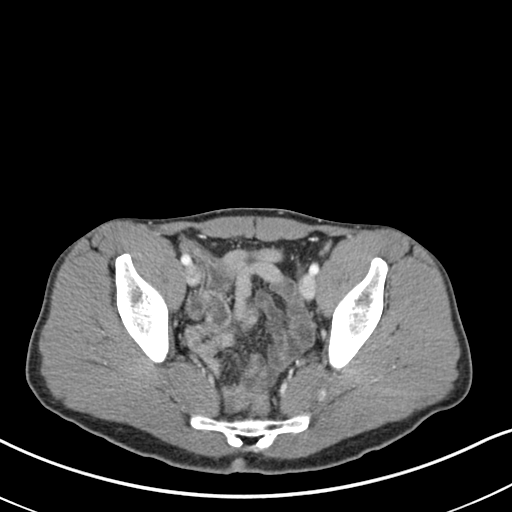
[im 34/88  soft-tissue]
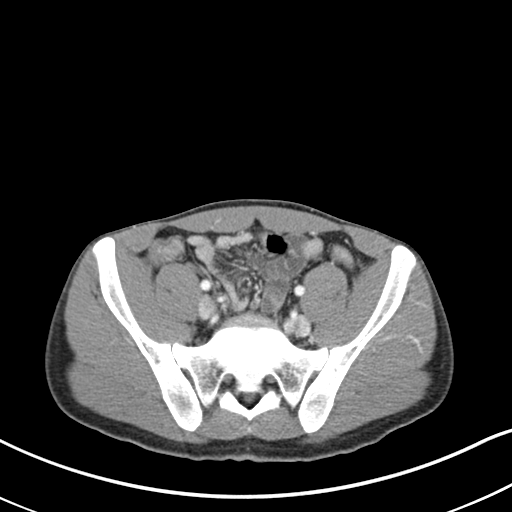
[im 39/88  soft-tissue]
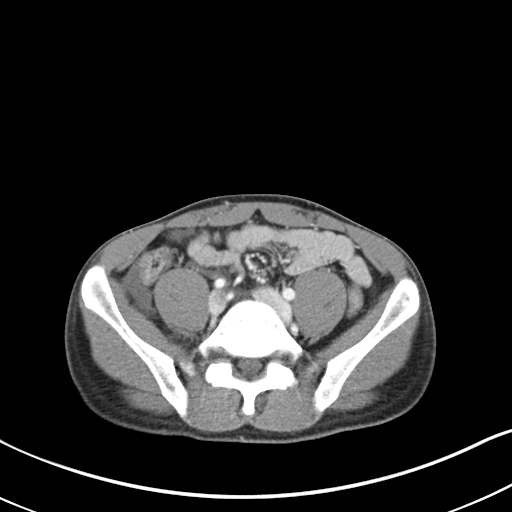
[im 49/88  soft-tissue]
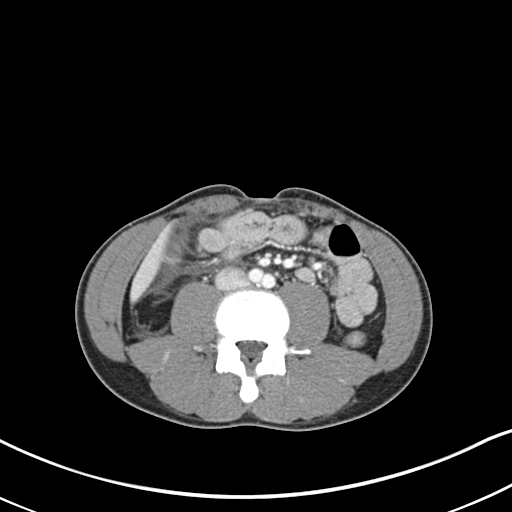
[im 54/88  soft-tissue]
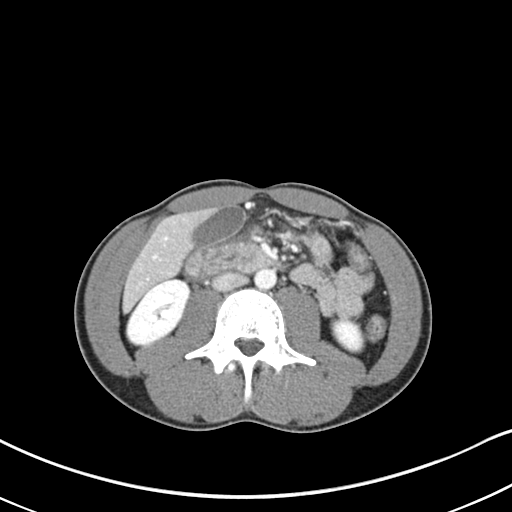
[im 63/88  soft-tissue]
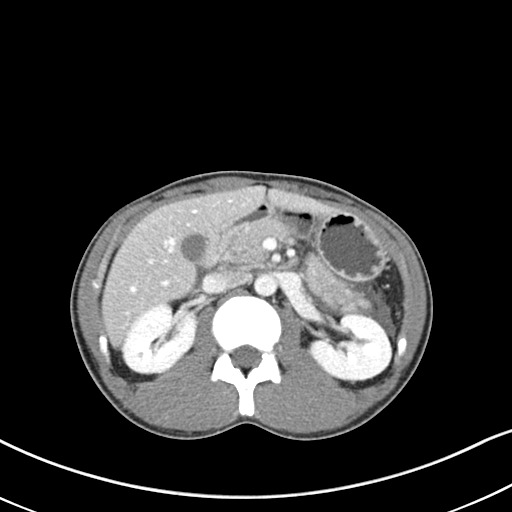
[im 63/88  bone]
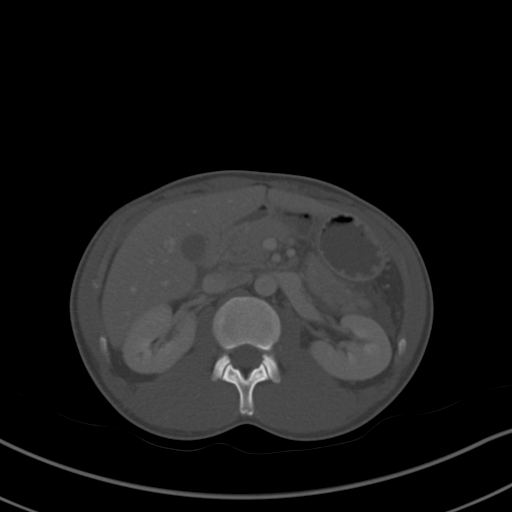
[im 68/88  soft-tissue]
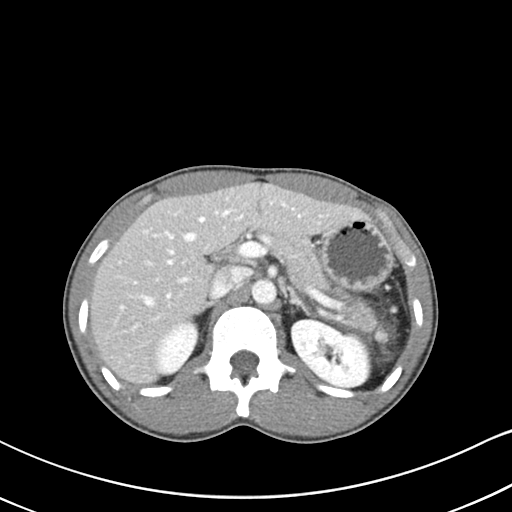
[im 73/88  soft-tissue]
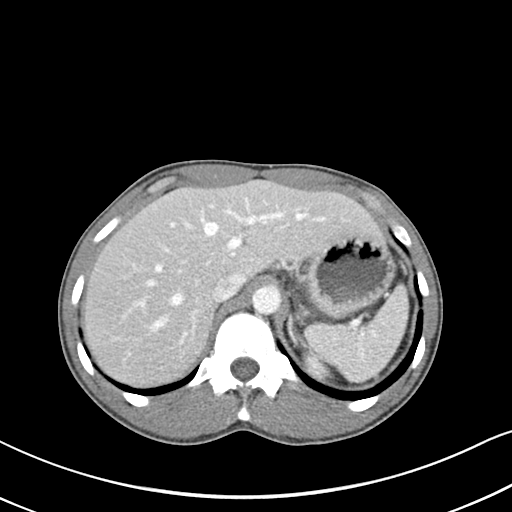
[im 83/88  soft-tissue]
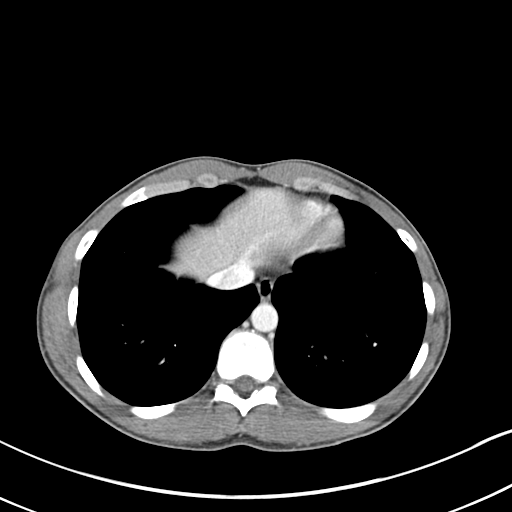

[Series 5: coronal st · coronal · 0.66mm/px · 3 of 94 slices shown]
[im 32/94  soft-tissue]
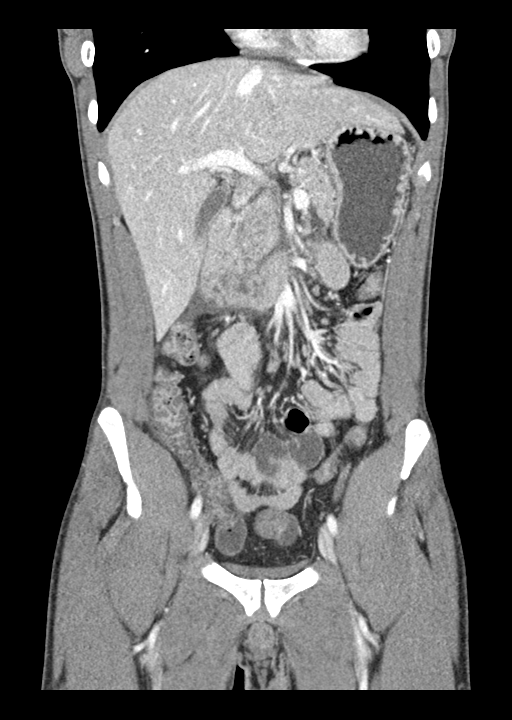
[im 42/94  soft-tissue]
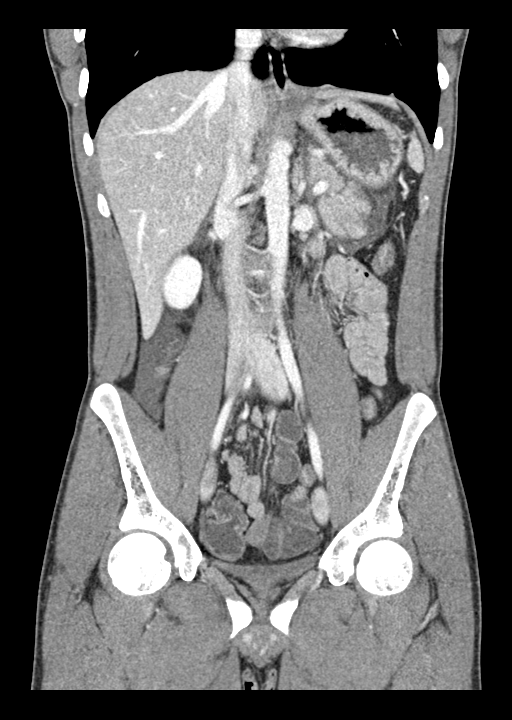
[im 52/94  soft-tissue]
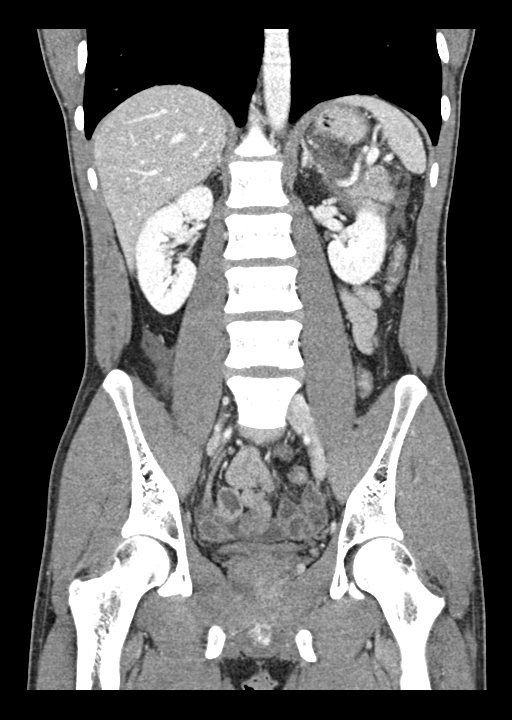

[15 of 46 positions shown; findings below may reference images not displayed]

FINDINGS: Lower chest: Lung bases are clear. Normal heart size. No pericardial
effusion.

Hepatobiliary: No worrisome focal liver abnormality is seen. Normal
gallbladder. No visible calcified gallstones. No biliary ductal
dilatation.

Pancreas: Diffusely edematous appearance of the pancreas with some
slight heterogeneity towards the pancreatic tail. Surrounding
edematous peripancreatic fluid is noted. No large regions of
necrosis. No pancreatic ductal dilatation. No organized abscess or
collection.

Spleen: Normal in size. No concerning splenic lesions.

Adrenals/Urinary Tract: Normal adrenals. Kidneys are normally
located with symmetric enhancement. No suspicious renal lesion,
urolithiasis or hydronephrosis. Urinary bladder is largely
decompressed at the time of exam and therefore poorly evaluated by
CT imaging.

Stomach/Bowel: Distal esophagus and stomach are unremarkable.
Thickened and heterogeneous appearance of the duodenal sweep,
particularly towards the head and uncinate of the pancreas may be
reactive duodenitis. No other small bowel thickening or dilatation.
Some mild edematous changes are seen along the colon, particularly
within the retroperitoneal segments which may be reactive to the
peripancreatic inflammatory change. Normal appendix. No evidence of
obstruction.

Vascular/Lymphatic: No significant vascular findings are present. No
enlarged abdominal or pelvic lymph nodes.

Reproductive: The prostate and seminal vesicles are unremarkable.

Other: With peripancreatic edematous fluid also with fluid tracking
in the retroperitoneum and along the right pericolic gutter. No free
air. No bowel containing hernia.

Musculoskeletal: No acute osseous abnormality or suspicious osseous
lesion.
IMPRESSION: 1. Features most consistent with an acute interstitial edematous
pancreatitis. Some slight heterogeneity of the pancreatic tail is
more nonspecific without large region pancreatic necrosis or
organized collection at this time. Adjacent edematous features and
likely reactive fluid throughout the retroperitoneum and tracking
into right pericolic gutter. Recommend correlation with lipase.
2. Thickening and heterogeneity of the duodenal sweep, particularly
the 2nd to 3rd portion as it crosses adjacent the duodenal head and
uncinate is favored to be reactive. Additional likely reactive
edematous thickening of the colon as well. Could correlate with
clinical assessment of bowel symptoms

## 2021-11-05 IMAGING — CR DG ABDOMEN ACUTE W/ 1V CHEST
3 series · 3 of 3 positions shown · non-contrast
Comparison: CT 01/23/2021

CLINICAL DATA: Pain

EXAM:
DG ABDOMEN ACUTE WITH 1 VIEW CHEST

[w chest pa]
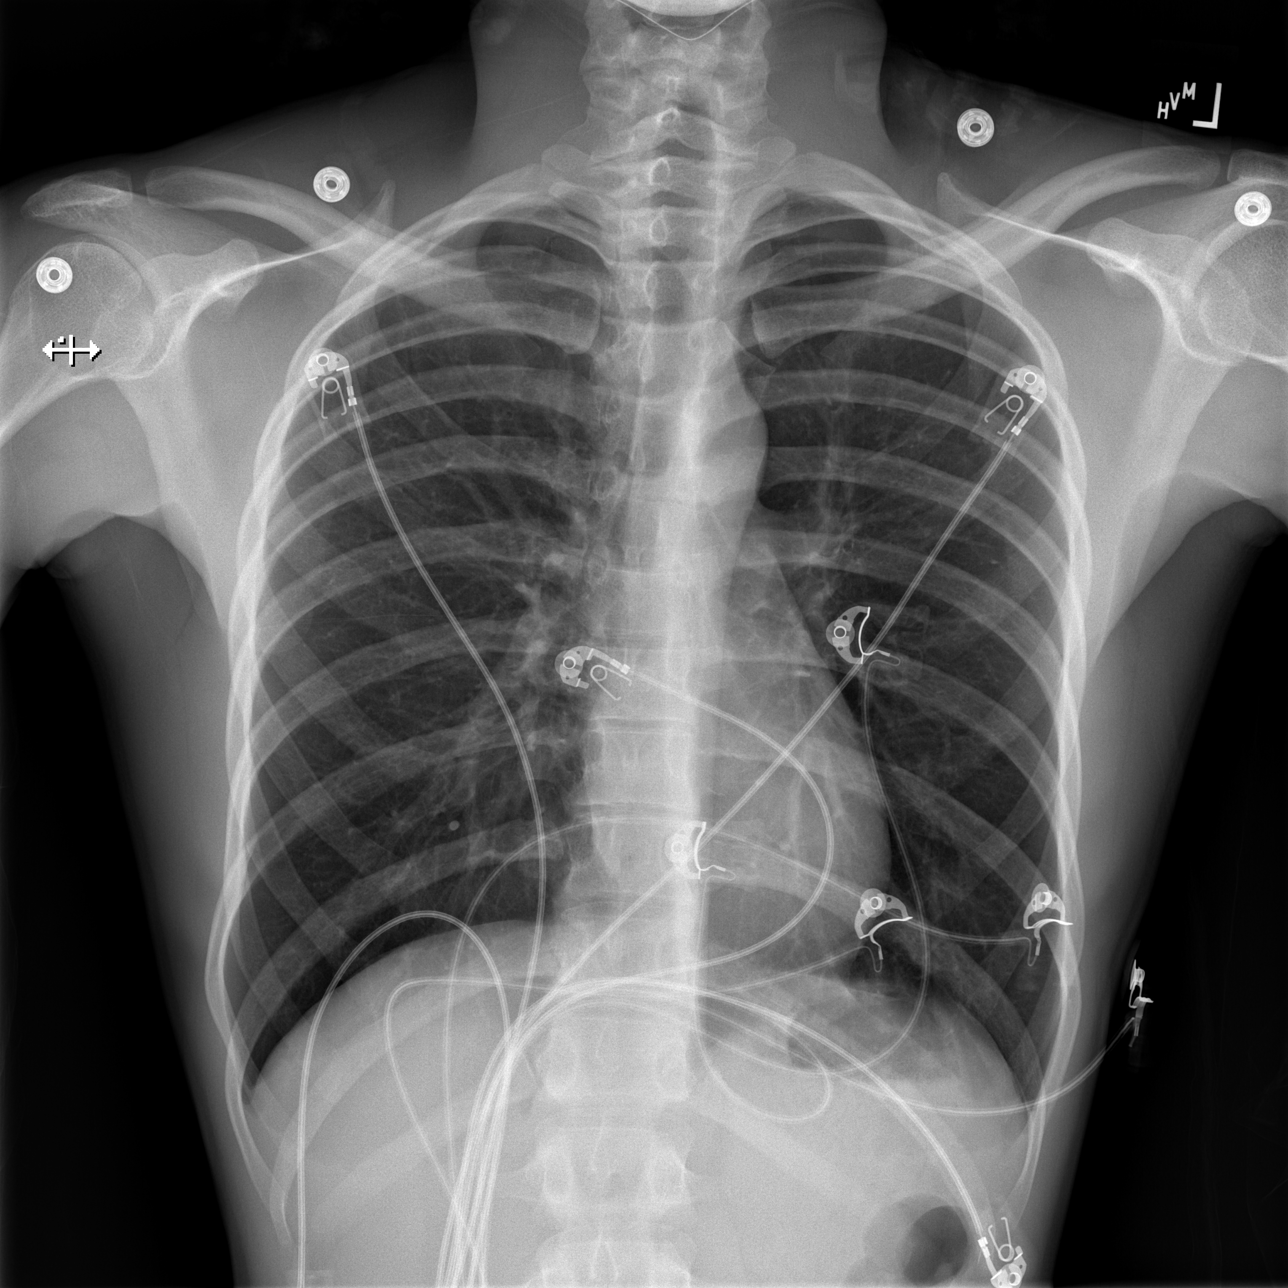

[w abdomen upright]
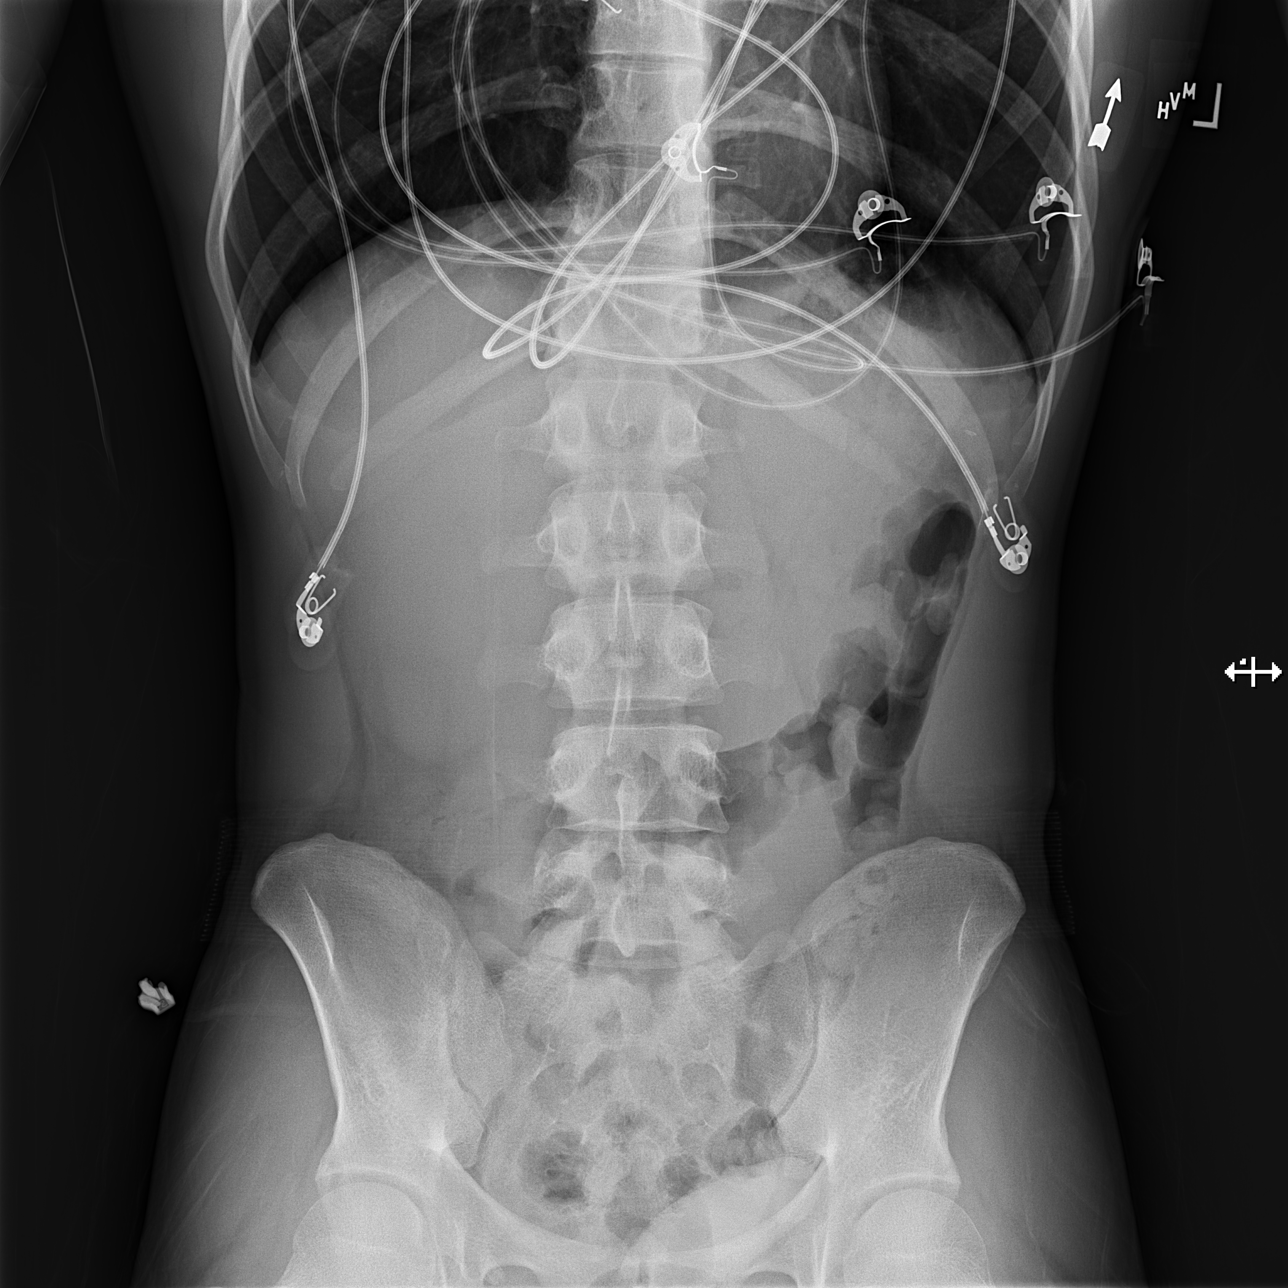

[t abdomen supine]
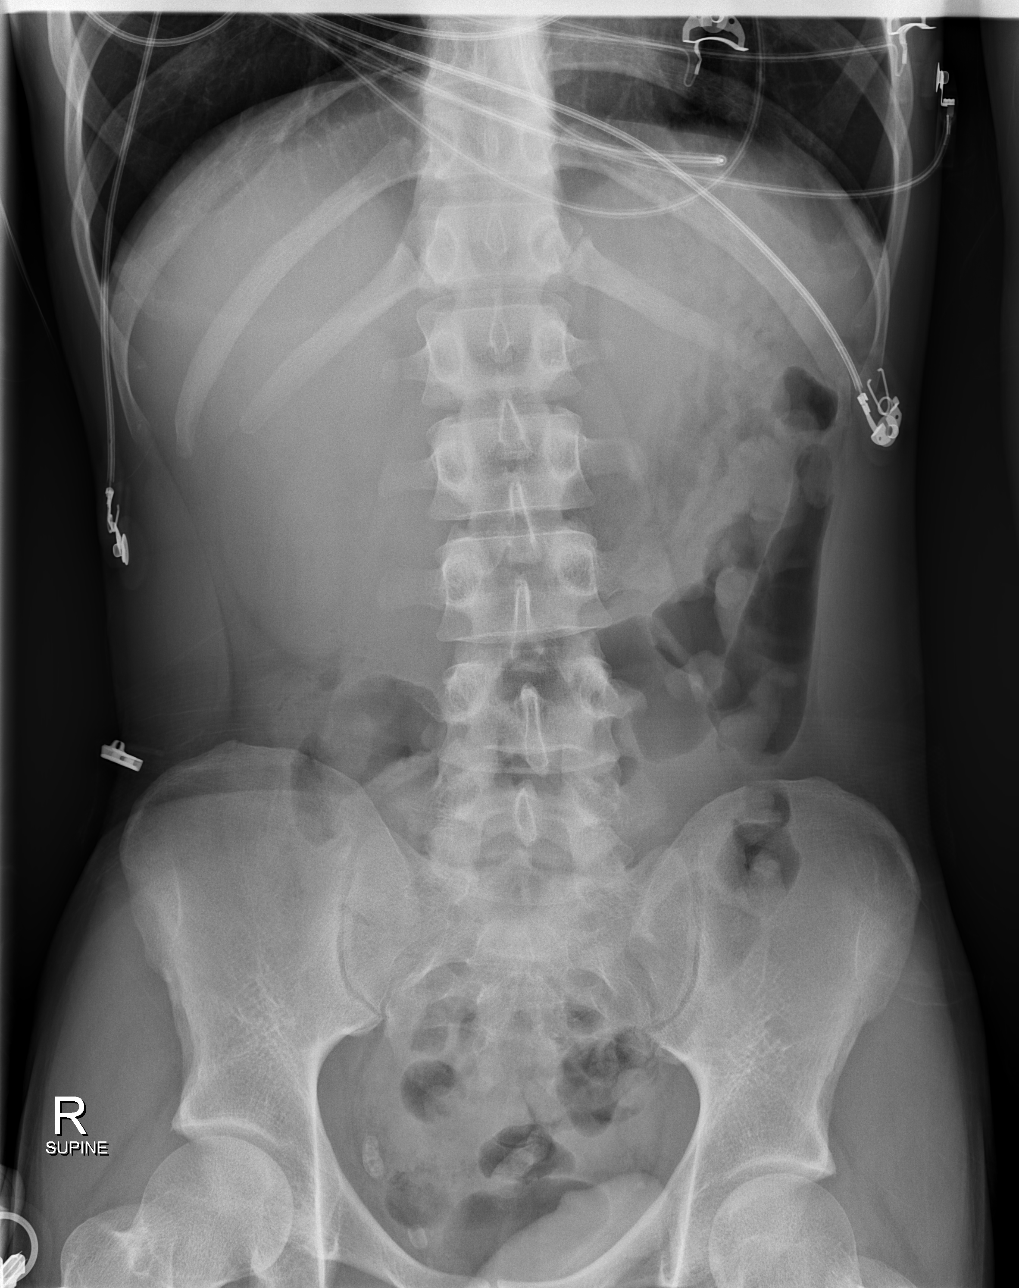

[3 of 3 positions shown; findings below may reference images not displayed]

FINDINGS: There is no evidence of dilated bowel loops or free intraperitoneal
air. No radiopaque calculi or other significant radiographic
abnormality is seen. Heart size and mediastinal contours are within
normal limits. Both lungs are clear.
IMPRESSION: Negative abdominal radiographs.  No acute cardiopulmonary disease.
